# Patient Record
Sex: Female | Born: 1960 | Race: Black or African American | Hispanic: No | Marital: Single | Smoking: Never smoker
Health system: Southern US, Community
[De-identification: ages and names within clinical notes are randomized; demographics above are authoritative.]

## PROBLEM LIST (undated history)

## (undated) DIAGNOSIS — J45909 Unspecified asthma, uncomplicated: Secondary | ICD-10-CM

## (undated) DIAGNOSIS — M1990 Unspecified osteoarthritis, unspecified site: Secondary | ICD-10-CM

## (undated) DIAGNOSIS — E119 Type 2 diabetes mellitus without complications: Secondary | ICD-10-CM

---

## 2015-07-09 ENCOUNTER — Encounter (HOSPITAL_COMMUNITY): Payer: Self-pay | Admitting: Emergency Medicine

## 2015-07-09 ENCOUNTER — Emergency Department (HOSPITAL_COMMUNITY): Payer: Self-pay

## 2015-07-09 ENCOUNTER — Emergency Department (HOSPITAL_COMMUNITY)
Admission: EM | Admit: 2015-07-09 | Discharge: 2015-07-10 | Disposition: A | Discharge status: Home / Self Care | Payer: Self-pay | Attending: Emergency Medicine | Admitting: Emergency Medicine

## 2015-07-09 DIAGNOSIS — F29 Unspecified psychosis not due to a substance or known physiological condition: Secondary | ICD-10-CM | POA: Insufficient documentation

## 2015-07-09 DIAGNOSIS — J45901 Unspecified asthma with (acute) exacerbation: Secondary | ICD-10-CM | POA: Insufficient documentation

## 2015-07-09 DIAGNOSIS — F419 Anxiety disorder, unspecified: Secondary | ICD-10-CM | POA: Insufficient documentation

## 2015-07-09 DIAGNOSIS — E119 Type 2 diabetes mellitus without complications: Secondary | ICD-10-CM | POA: Insufficient documentation

## 2015-07-09 DIAGNOSIS — F39 Unspecified mood [affective] disorder: Secondary | ICD-10-CM | POA: Diagnosis present

## 2015-07-09 DIAGNOSIS — R0602 Shortness of breath: Secondary | ICD-10-CM

## 2015-07-09 HISTORY — DX: Unspecified asthma, uncomplicated: J45.909

## 2015-07-09 HISTORY — DX: Type 2 diabetes mellitus without complications: E11.9

## 2015-07-09 LAB — CBC WITH DIFFERENTIAL/PLATELET
BASOS ABS: 0.1 10*3/uL (ref 0.0–0.1)
Basophils Relative: 1 %
EOS ABS: 0.1 10*3/uL (ref 0.0–0.7)
Eosinophils Relative: 1 %
HCT: 37.6 % (ref 36.0–46.0)
HEMOGLOBIN: 12.1 g/dL (ref 12.0–15.0)
LYMPHS PCT: 21 %
Lymphs Abs: 1.1 10*3/uL (ref 0.7–4.0)
MCH: 27.3 pg (ref 26.0–34.0)
MCHC: 32.2 g/dL (ref 30.0–36.0)
MCV: 84.9 fL (ref 78.0–100.0)
Monocytes Absolute: 0.5 10*3/uL (ref 0.1–1.0)
Monocytes Relative: 9 %
NEUTROS ABS: 3.3 10*3/uL (ref 1.7–7.7)
NEUTROS PCT: 68 %
Platelets: 257 10*3/uL (ref 150–400)
RBC: 4.43 MIL/uL (ref 3.87–5.11)
RDW: 13.5 % (ref 11.5–15.5)
WBC: 5.1 10*3/uL (ref 4.0–10.5)

## 2015-07-09 LAB — RAPID URINE DRUG SCREEN, HOSP PERFORMED
Amphetamines: NOT DETECTED
BENZODIAZEPINES: NOT DETECTED
Barbiturates: NOT DETECTED
COCAINE: NOT DETECTED
OPIATES: NOT DETECTED
TETRAHYDROCANNABINOL: NOT DETECTED

## 2015-07-09 LAB — ETHANOL: Alcohol, Ethyl (B): <5 mg/dL (ref <5)

## 2015-07-09 LAB — BASIC METABOLIC PANEL
ANION GAP: 12 (ref 5–15)
BUN: 11 mg/dL (ref 6–20)
CHLORIDE: 103 mmol/L (ref 101–111)
CO2: 27 mmol/L (ref 22–32)
Calcium: 9.7 mg/dL (ref 8.9–10.3)
Creatinine, Ser: 0.86 mg/dL (ref 0.44–1.00)
GFR calc non Af Amer: >60 mL/min (ref >60)
GLUCOSE: 89 mg/dL (ref 65–99)
POTASSIUM: 4.7 mmol/L (ref 3.5–5.1)
Sodium: 142 mmol/L (ref 135–145)

## 2015-07-09 LAB — URINALYSIS, ROUTINE W REFLEX MICROSCOPIC
BILIRUBIN URINE: NEGATIVE
Glucose, UA: NEGATIVE mg/dL
Hgb urine dipstick: NEGATIVE
Ketones, ur: NEGATIVE mg/dL
NITRITE: NEGATIVE
Protein, ur: NEGATIVE mg/dL
SPECIFIC GRAVITY, URINE: 1.01 (ref 1.005–1.030)
pH: 8.5 — ABNORMAL HIGH (ref 5.0–8.0)

## 2015-07-09 LAB — SALICYLATE LEVEL

## 2015-07-09 LAB — URINE MICROSCOPIC-ADD ON

## 2015-07-09 LAB — ACETAMINOPHEN LEVEL

## 2015-07-09 LAB — CBG MONITORING, ED: Glucose-Capillary: 115 mg/dL — ABNORMAL HIGH (ref 65–99)

## 2015-07-09 MED ORDER — ACETAMINOPHEN 325 MG PO TABS
650.0000 mg | ORAL_TABLET | Freq: Four times a day (QID) | ORAL | Status: DC | PRN
  Administered 2015-07-09 – 2015-07-10 (×4): 650 mg via ORAL
  Filled 2015-07-09 (×4): qty 2

## 2015-07-09 MED ORDER — ACETAMINOPHEN 325 MG PO TABS: 650.0000 mg | ORAL_TABLET | Freq: Once | ORAL | Status: DC

## 2015-07-09 MED ORDER — ALBUTEROL SULFATE HFA 108 (90 BASE) MCG/ACT IN AERS
2.0000 | INHALATION_SPRAY | RESPIRATORY_TRACT | Status: DC | PRN
  Filled 2015-07-09: qty 6.7

## 2015-07-09 MED ORDER — DIAZEPAM 5 MG/ML IJ SOLN
5.0000 mg | Freq: Once | INTRAMUSCULAR | Status: AC
  Administered 2015-07-09: 5 mg via INTRAMUSCULAR
  Filled 2015-07-09: qty 2

## 2015-07-09 MED ORDER — GUAIFENESIN-DM 100-10 MG/5ML PO SYRP
5.0000 mL | ORAL_SOLUTION | ORAL | Status: DC | PRN
  Administered 2015-07-09 (×2): 5 mL via ORAL
  Filled 2015-07-09: qty 5
  Filled 2015-07-09 (×2): qty 10

## 2015-07-09 MED ORDER — BENZONATATE 100 MG PO CAPS
100.0000 mg | ORAL_CAPSULE | Freq: Once | ORAL | Status: AC
  Administered 2015-07-09: 200 mg via ORAL
  Filled 2015-07-09: qty 2

## 2015-07-09 MED ORDER — ALBUTEROL SULFATE HFA 108 (90 BASE) MCG/ACT IN AERS
2.0000 | INHALATION_SPRAY | RESPIRATORY_TRACT | Status: DC | PRN
  Administered 2015-07-09: 2 via RESPIRATORY_TRACT

## 2015-07-09 MED ORDER — FLUTICASONE PROPIONATE 50 MCG/ACT NA SUSP
2.0000 | Freq: Every day | NASAL | Status: DC
  Administered 2015-07-09: 2 via NASAL
  Filled 2015-07-09: qty 16

## 2015-07-09 MED ORDER — MENTHOL 3 MG MT LOZG
1.0000 | LOZENGE | OROMUCOSAL | Status: DC | PRN
  Administered 2015-07-09: 3 mg via ORAL
  Filled 2015-07-09: qty 9

## 2015-07-09 NOTE — ED Notes (Signed)
Patient transported to X-ray 

## 2015-07-09 NOTE — ED Notes (Addendum)
Pt is well spoken and stated she attended [employer] years ago and that is how she ended up back in  [place] from [place] . Pt stated she was living with her brother in [place] but felt that she was a burden so saved her food stamps and sold them to buy a bus ticket. Pt stated she wants to work and have a place to live. She is fearful that we are holding her here and she will incur huge bills she can not pay. Pt was reluctant to give Tom her brothers contact information for fear he would somehow use her social security number. At times pt does appear paranoid that we are writing bad things about her to make her have to stay here long .Pt c/o a cough.-Lung sounds are clear. She does state she has been on proair and advair but unsure of the dosages. Phoned EDP and pt was placed on cough syrup. Lung sounds clear with no rales/rhonchi.6pm Pt awakened to eat supper. She appears calm and stated she had a good nap. Pt did request more cough syrup but was told it was not time. Phoned EDP for tessalon pearls. 9:15pm will continue to monitor the pt closely. Pt c/o sore throat, nasal congestion but presently is afebrile. She stated earlier in the week she was vomitting. Pt was medicated with cough syrup and tessalon perals. Encouraging pt to drink po. Hot tea offered. 10:40pm _Pt was given 2 puffs of albuterol inhaler and a cough drop. She c/o nasal congestion and was given some flonase as well. Phoned charge to make her aware of the pts symptoms. (10:45pm)

## 2015-07-09 NOTE — ED Provider Notes (Signed)
CSN: [id number]     Arrival date & time 07/09/15  4098 History   First MD Initiated Contact with Patient 07/09/15 [phone]     Chief Complaint  Patient presents with  . Shortness of Breath    HPI   Ashley Hunt is an 55 y.o. female with history of asthma who presents [facility] of SOB.  She states her symptoms began one day ago while she was eating in the cafeteria of [facility]. States she drank coffee and suddenly felt like she was having an asthma attack and felt like she couldn't catch a good breath. States she feels like her throat is being tickled. States she has been on advair and albuterol rescue inhaler in the past but has not required either in over one year. States she recently moved to this area from [place]. She denies chest pain. Denies abdominal pain, n/v/d. During our conversation she seems anxious and has tangential thought and flight of ideas. She denies anxiety. She has had several instances of nonsensical words such as suddenly talking about "colored sequins" and then stating "i'm not lying with nobody." She takes breaths in the middle of her sentences but she has no retractions, actual increased WOB, or any wheezing on her exam. She is not tachypneic. She vehemently denies SI/HI and VH/AH.   Past Medical History  Diagnosis Date  . Asthma   . Diabetes mellitus without complication (HCC)    History reviewed. No pertinent past surgical history. Family History  Problem Relation Age of Onset  . Family history unknown: Yes   Social History  Substance Use Topics  . Smoking status: Never Smoker   . Smokeless tobacco: None  . Alcohol Use: No   OB History    Gravida Para Term Preterm AB TAB SAB Ectopic Multiple Living       Review of Systems  All other systems reviewed and are negative.     Allergies  Review of patient's allergies indicates no known allergies.  Home Medications   Prior to Admission  medications   Not on File   BP 119/74 mmHg  Pulse 93  Temp(Src) 98.5 F (36.9 C) (Oral)  Resp 20  SpO2 100% Physical Exam  Constitutional: She is oriented to person, place, and time.  HENT:  Right Ear: External ear normal.  Left Ear: External ear normal.  Nose: Nose normal.  Mouth/Throat: Oropharynx is clear and moist.  Eyes: Conjunctivae and EOM are normal. Pupils are equal, round, and reactive to light.  Neck: Normal range of motion. Neck supple.  Cardiovascular: Normal rate, regular rhythm, normal heart sounds and intact distal pulses.   Pulmonary/Chest: Effort normal and breath sounds normal. No respiratory distress. She has no wheezes. She has no rales. She exhibits no tenderness.  Abdominal: Soft. Bowel sounds are normal. She exhibits no distension. There is no tenderness.  Musculoskeletal: She exhibits no edema.  Neurological: She is alert and oriented to person, place, and time.  Skin: Skin is warm and dry.  Psychiatric: Her mood appears anxious. Her speech is rapid and/or pressured and tangential.  Nursing note and vitals reviewed.   ED Course  Procedures (including critical care time) Labs Review Labs Reviewed  ACETAMINOPHEN LEVEL - Abnormal; Notable for the following:    Acetaminophen (Tylenol), Serum <10 (*)    All other components within normal limits  URINALYSIS, ROUTINE W REFLEX MICROSCOPIC (NOT AT [facility]) -  Abnormal; Notable for the following:    APPearance CLOUDY (*)    pH 8.5 (*)    Leukocytes, UA MODERATE (*)    All other components within normal limits  URINE MICROSCOPIC-ADD ON - Abnormal; Notable for the following:    Squamous Epithelial / LPF 6-30 (*)    Bacteria, UA FEW (*)    All other components within normal limits  BASIC METABOLIC PANEL  ETHANOL  SALICYLATE LEVEL  CBC WITH DIFFERENTIAL/PLATELET  URINE RAPID DRUG SCREEN, HOSP PERFORMED    Imaging Review Dg Chest 2 View  07/09/2015  CLINICAL DATA:  Shortness breath, having flight of ideas.  EXAM: CHEST  2 VIEW COMPARISON:  None. FINDINGS: The heart size and mediastinal contours are within normal limits. Both lungs are clear. The visualized skeletal structures are unremarkable. IMPRESSION: No active cardiopulmonary disease. Electronically Signed   By: Elige Ko   On: 07/09/2015 10:00   I have personally reviewed and evaluated these images and lab results as part of my medical decision-making.   EKG Interpretation   Date/Time:  Wednesday July 09 2015 09:07:50 EST Ventricular Rate:  77 PR Interval:  111 QRS Duration: 120 QT Interval:  425 QTC Calculation: 481 R Axis:   77 Text Interpretation:  Sinus or ectopic atrial rhythm Borderline short PR  interval Nonspecific intraventricular conduction delay Artifact in lead(s)  I III aVR aVL aVF V1 V2 and baseline wander in lead(s) V3 Abnormal ECG No  previous tracing Confirmed by BEATON  MD, ROBERT ([id number]) on 07/09/2015  9:09:13 AM      MDM   Final diagnoses:  None    Pt is medically clear. I do not hear any adventitious lung sounds.  Her labs are unremarkable and CXR negative. TTS consulted and pt placed in psych hold given tangential thought and flight of ideas.     Carlene Coria, PA-C 07/09/15 1427  Nelva Nay, MD 07/10/15 [phone]

## 2015-07-09 NOTE — ED Notes (Addendum)
Pt arrived via EMS from [facility] with report of pt c/o asthama, abd pain and anxiety. Pt stating that her breathing is restricting in her neck. Pt having flight of ideas but denies SI/HI and audible/visual hallucinations. Pt moved here from [place] x1 week ago.

## 2015-07-09 NOTE — ED Notes (Signed)
Bed: ZO10 Expected date:  Expected time:  Means of arrival:  Comments: RM 11

## 2015-07-09 NOTE — ED Notes (Signed)
PSych in room

## 2015-07-09 NOTE — ED Notes (Signed)
Pt given sandwich, graham cracker and OJ.

## 2015-07-09 NOTE — ED Notes (Signed)
Bed: ZO10 Expected date:  Expected time:  Means of arrival:  Comments: EMS- 54yo F, behavior issues
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
## 2015-07-09 NOTE — ED Notes (Signed)
Lunch tray placed in patient room

## 2015-07-09 NOTE — BH Assessment (Addendum)
Tele Assessment Note   Ashley Hunt is an 55 y.o. female that presents this date from [facility] that was transported by EMS from [facility]. Patient is very disorganized upon presentation and was difficult to redirect. Patient was very confused at times, and would string words together (word salad) so information could not be obtained. Patient was not time, place oriented and said the year was "every year God said it would be." Patient did state that she came here on Saturday 07/05/15 from [place]  by bus to see and reside with a boyfriend that she has not had contact with in years. When patient realized boyfriend no longer resided in [place] she went to [employer] and after staff interviewed her had her transported to [facility] by EMS. Patient was a poor historian and was unable to answer the questions on the assessment, this writer gathered collateral from the intake nurse who stated patient had been talking to herself and was easily agitated when spoken to. Patient could not provide any mental health history and would move out of view of the monitor refusing at times, to stand in front of the camera. This Clinical research associate was unable to complete an assessment and contacted Ashley Hunt to staff case with her who stated she would also evaluate patient while in the SAPPU. After speaking to patient Ashley Hunt agreed patient was very disorganized and more information was needed before a disposition could be rendered. Information in reference to patient's brother was obtained from patient although this writer could not reach the brother at the number provided [phone]. Patient will be held in SAPPU overnight and revaluated in the a.m. after further information is obtained or patient becomes stable.          Diagnosis: Psychosis  Past Medical History:  Past Medical History  Diagnosis Date  . Asthma   . Diabetes mellitus without complication (HCC)     History reviewed. No pertinent past surgical  history.  Family History:  Family History  Problem Relation Age of Onset  . Family history unknown: Yes    Social History:  reports that she has never smoked. She does not have any smokeless tobacco history on file. She reports that she does not drink alcohol or use illicit drugs.  Additional Social History:  Alcohol / Drug Use Pain Medications: See MAR Prescriptions: See MAR Over the Counter: See MAR History of alcohol / drug use?: No history of alcohol / drug abuse (Denies although waiting on UDS, status in question) Longest period of sobriety (when/how long): Denies  CIWA: CIWA-Ar BP: 119/74 mmHg Pulse Rate: 93 COWS:    PATIENT STRENGTHS: (choose at least two) Average or above average intelligence Supportive family/friends  Allergies: No Known Allergies  Home Medications:  (Not in a hospital admission)  OB/GYN Status:  Patient's last menstrual period was 05/25/2015 (approximate).  General Assessment Data [facility] of Assessment: [facility] TTS Assessment: In system Is this a Tele or Face-to-Face Assessment?: Tele Assessment Is this an Initial Assessment or a Re-assessment for this encounter?: Initial Assessment Marital status: Single Maiden name: na Is patient pregnant?: Unknown Pregnancy Status: Unknown Living Arrangements: Other (Comment) (Homeless) Can pt return to current living arrangement?: Yes (Currently being assisted by [employer]) Admission Status: Other (Comment) Is patient capable of signing voluntary admission?: No (Status pending waiting UDS results  ) Referral Source: Self/Family/Friend Insurance type: none  Medical Screening Exam [facility] Walk-in ONLY) Medical Exam completed: Yes  Crisis Care Plan Living Arrangements: Other (Comment) (Homeless) Legal  Guardian: Other: (Unknown) Name of Psychiatrist: none Name of Therapist: none  Education Status Is patient currently in school?: No Current Grade: na Highest grade of school patient has completed:  68 Name of school: na Contact person: na  Risk to self with the past 6 months Suicidal Ideation: No Has patient been a risk to self within the past 6 months prior to admission? : Other (comment) (Unknown) Suicidal Intent: No Has patient had any suicidal intent within the past 6 months prior to admission? : Other (comment) Is patient at risk for suicide?: No Suicidal Plan?: No Has patient had any suicidal plan within the past 6 months prior to admission? : No Access to Means: No What has been your use of drugs/alcohol within the last 12 months?: Denies Previous Attempts/Gestures: No How many times?: 0 Other Self Harm Risks: None Triggers for Past Attempts: Unknown Intentional Self Injurious Behavior: None Family Suicide History: Unknown Recent stressful life event(s): Other (Comment) (relocated to [facility]) Persecutory voices/beliefs?: No Depression: Yes Depression Symptoms: Despondent Substance abuse history and/or treatment for substance abuse?: No Suicide prevention information given to non-admitted patients: Not applicable  Risk to Others within the past 6 months Homicidal Ideation: No Does patient have any lifetime risk of violence toward others beyond the six months prior to admission? : Unknown Thoughts of Harm to Others: No Current Homicidal Intent: No Current Homicidal Plan: No Access to Homicidal Means: No Identified Victim: na History of harm to others?: No Assessment of Violence: None Noted Violent Behavior Description: na Does patient have access to weapons?: No Criminal Charges Pending?: No Does patient have a court date: No Is patient on probation?: Unknown  Psychosis Hallucinations: None noted Delusions: None noted  Mental Status Report Appearance/Hygiene: In scrubs Eye Contact: Poor Motor Activity: Agitation Speech: Aggressive Level of Consciousness: Alert Mood: Anxious Affect: Anxious Anxiety Level: Minimal Thought Processes:  Tangential Judgement: Impaired Orientation: Not oriented Obsessive Compulsive Thoughts/Behaviors: None  Cognitive Functioning Concentration: Poor Memory: Recent Intact, Remote Intact IQ: Average Level of Function: Unknown Insight: Poor Impulse Control: Unable to Assess Appetite: Fair Weight Loss: 0 Weight Gain: 0 Sleep: Decreased Total Hours of Sleep: 4 Vegetative Symptoms: None  ADLScreening [facility] Assessment Services) Patient's cognitive ability adequate to safely complete daily activities?: No Patient able to express need for assistance with ADLs?: Yes Independently performs ADLs?: Yes (appropriate for developmental age)  Prior Inpatient Therapy Prior Inpatient Therapy: No Prior Therapy Dates: Unknown Prior Therapy Facilty/Provider(s): Unknown Reason for Treatment: Unknown  Prior Outpatient Therapy Prior Outpatient Therapy: No Prior Therapy Dates: Unknown Prior Therapy Facilty/Provider(s): Unknown Reason for Treatment: Unknown Does patient have an ACCT team?: No Does patient have Intensive In-House Services?  : No Does patient have [facility] services? : No Does patient have P4CC services?: No  ADL Screening (condition at time of admission) Patient's cognitive ability adequate to safely complete daily activities?: No Is the patient deaf or have difficulty hearing?: No Does the patient have difficulty seeing, even when wearing glasses/contacts?: No Does the patient have difficulty concentrating, remembering, or making decisions?: Yes Patient able to express need for assistance with ADLs?: Yes Does the patient have difficulty dressing or bathing?: No Independently performs ADLs?: Yes (appropriate for developmental age) Does the patient have difficulty walking or climbing stairs?: No Weakness of Legs: None Weakness of Arms/Hands: None  Home Assistive Devices/Equipment Home Assistive Devices/Equipment: None  Therapy Consults (therapy consults require a physician  order) PT Evaluation Needed: No OT Evalulation Needed: No SLP Evaluation Needed: No Abuse/Neglect  Assessment (Assessment to be complete while patient is alone) Physical Abuse: Denies Verbal Abuse: Denies Sexual Abuse: Denies Exploitation of patient/patient's resources: Denies Self-Neglect: Denies Values / Beliefs Cultural Requests During Hospitalization: None Spiritual Requests During Hospitalization: None Consults Spiritual Care Consult Needed: No Social Work Consult Needed: No Merchant navy officer (For Healthcare) Does patient have an advance directive?: No Would patient like information on creating an advanced directive?: No - patient declined information    Additional Information 1:1 In Past 12 Months?: No CIRT Risk: No Elopement Risk: No Does patient have medical clearance?: No     Disposition: Patient will be held in SAPPU overnight and revaluated in the a.m. after further information is obtained or patient becomes stable.           Disposition Initial Assessment Completed for this Encounter: Yes Disposition of Patient: Other dispositions ( consult) Other disposition(s): Other (Comment) (Pending consult)  Alfredia Ferguson 07/09/2015 11:22 AM

## 2015-07-09 NOTE — ED Notes (Signed)
Awake. Verbally responsive. Resp even and unlabored. ABC's intact. Pt continues to report that she needs to wear oxygen d/t "restriction of my breathing" Pt sats at 100% RA. Pt informed that she does not need oxygen at this time. NAD noted.

## 2015-07-10 DIAGNOSIS — F39 Unspecified mood [affective] disorder: Secondary | ICD-10-CM

## 2015-07-10 DIAGNOSIS — F419 Anxiety disorder, unspecified: Secondary | ICD-10-CM

## 2015-07-10 NOTE — BH Assessment (Signed)
[facility] Assessment Progress Note  Per Thedore Mins, MD, this pt does not require psychiatric hospitalization at this time.  She also does not require any outpatient behavioral health services.  She is to be discharged from [facility] with information for supportive services for the homeless.  Discharge instructions include information for [facility], [employer] and the [employer].  Pt's nurse, Dawnaly, has been notified.  Doylene Canning, MA Triage Specialist [phone]

## 2015-07-10 NOTE — ED Notes (Signed)
Patient denies SI, HI and AVH and states that there nothing wrong with her. Plan of care discussed. Patient given a meal. Encouragement and support provided and safety maintain. Q 15 min safety checks in place.

## 2015-07-10 NOTE — Discharge Instructions (Addendum)
For your shelter needs, contact one of the following shelters:       [facility] (operated by [facility])      [address] [place], [place] [address]      [phone]       [facility]      [address]      [place], [place] [address]      [phone]  For a wide range of supportive services for the homeless, contact the [facility]:       [facility]      [address] [place], [place] [address]      [phone]

## 2015-07-10 NOTE — ED Notes (Signed)
Bed: WBH39 Expected date:  Expected time:  Means of arrival:  Comments: Hold for 33 

## 2015-07-10 NOTE — Progress Notes (Signed)
D/c instructions entered in EPIC  Please use the resources provided to you in emergency room by case manager to assist with doctor for follow up These [place] uninsured resources provide possible primary care providers, resources for discounted medications, housing, dental resources, affordable care act information, plus other resources for [employer]  [employer]) Go to [address], [place], [place] [address] [phone] by 2 pm Monday-Friday & sign in at the front desk. Come if you need showers, laundry, computer access, job counseling, resume help, referrals for food/clothing, Mental Health assist, housing

## 2015-07-10 NOTE — ED Notes (Signed)
Patient discharged to home.  She was given a bus pass and all belongings were returned.  She denies thoughts of harm to self or others.  She denies auditory or visual hallucinations.  She left the unit ambulatory.

## 2015-07-10 NOTE — Consult Note (Signed)
[facility] Psychiatry Consult   Reason for Consult:  Flu symptoms, Homelessness,  Referring Physician: EDP Patient Identification: Ashley Hunt MRN:  [id number] Principal Diagnosis: Unspecified mood (affective) disorder ([place]) Diagnosis:   Patient Active Problem List   Diagnosis Date Noted  . Unspecified mood (affective) disorder ([place]) [F39] 07/10/2015    Priority: High    Total Time spent with patient: 45 minutes  Subjective:   Ashley Hunt is a 55 y.o. female patient admitted with  Flu symptoms, Homelessness, .  HPI:  AA female, 55 years old was evaluated today for unspecified mood disorder.  Patient is alert and oriented x 3.  She stated that she is surprised she was sent to see Psychiatry.  Patient reports that she came in to the ER for throat discomfort and was sent to be seen by Psych.  She denies previous Psychiatric hx and have never taken any Psychotropic medications.  Patient however admitted that she moved down from [facility] after she felt she could not stay with her brother and sister in law.  Patient states she came to [facility] [place] to see and ex-boyfriend but found out that he moved away from this area.  Patient has been staying at [facility] and was brought to the ER by EMS for throat discomfort.  Patient denies SI/HI/AVH.  She is looking for job and plans to find a place to stay here in [place].   Attempt to contact her brother in [place] failed as the phone messages left were not returned.  She is Psychiatrically cleared and has been given information to the [facility])  Patient is discharged based on information received from [place].  Patient is given Tylenol for her fever and is ready to go home.   Patient is discharged.  Past Psychiatric History:  Denies  Risk to Self: Suicidal Ideation: No Suicidal Intent: No Is patient at risk for suicide?: No Suicidal Plan?: No Access to Means: No What has been your use of drugs/alcohol within the  last 12 months?: Denies How many times?: 0 Other Self Harm Risks: None Triggers for Past Attempts: Unknown Intentional Self Injurious Behavior: None Risk to Others: Homicidal Ideation: No Thoughts of Harm to Others: No Current Homicidal Intent: No Current Homicidal Plan: No Access to Homicidal Means: No Identified Victim: na History of harm to others?: No Assessment of Violence: None Noted Violent Behavior Description: na Does patient have access to weapons?: No Criminal Charges Pending?: No Does patient have a court date: No Prior Inpatient Therapy: Prior Inpatient Therapy: No Prior Therapy Dates: Unknown Prior Therapy Facilty/Provider(s): Unknown Reason for Treatment: Unknown Prior Outpatient Therapy: Prior Outpatient Therapy: No Prior Therapy Dates: Unknown Prior Therapy Facilty/Provider(s): Unknown Reason for Treatment: Unknown Does patient have an ACCT team?: No Does patient have Intensive In-House Services?  : No Does patient have [facility] services? : No Does patient have P4CC services?: No  Past Medical History:  Past Medical History  Diagnosis Date  . Asthma   . Diabetes mellitus without complication ([place])    History reviewed. No pertinent past surgical history. Family History:  Family History  Problem Relation Age of Onset  . Family history unknown: Yes   Family Psychiatric  History:  Denies Social History:  History  Alcohol Use No     History  Drug Use No    Social History   Social History  . Marital Status: Single    Spouse Name: N/A  . Number of Children: N/A  . Years  of Education: N/A   Social History Main Topics  . Smoking status: Never Smoker   . Smokeless tobacco: None  . Alcohol Use: No  . Drug Use: No  . Sexual Activity: No   Other Topics Concern  . None   Social History Narrative  . None   Additional Social History:    Pain Medications: See MAR Prescriptions: See MAR Over the Counter: See MAR History of alcohol / drug use?:  No history of alcohol / drug abuse (Denies although waiting on UDS, status in question) Longest period of sobriety (when/how long): Denies    Allergies:  No Known Allergies  Labs:  Results for orders placed or performed during the hospital encounter of 07/09/15 (from the past 48 hour(s))  Urinalysis, Routine w reflex microscopic     Status: Abnormal   Collection Time: 07/09/15  8:24 AM  Result Value Ref Range   Color, Urine YELLOW YELLOW   APPearance CLOUDY (A) CLEAR   Specific Gravity, Urine 1.010 1.005 - 1.030   pH 8.5 (H) 5.0 - 8.0   Glucose, UA NEGATIVE NEGATIVE mg/dL   Hgb urine dipstick NEGATIVE NEGATIVE   Bilirubin Urine NEGATIVE NEGATIVE   Ketones, ur NEGATIVE NEGATIVE mg/dL   Protein, ur NEGATIVE NEGATIVE mg/dL   Nitrite NEGATIVE NEGATIVE   Leukocytes, UA MODERATE (A) NEGATIVE  Urine rapid drug screen (hosp performed)     Status: None   Collection Time: 07/09/15  8:24 AM  Result Value Ref Range   Opiates NONE DETECTED NONE DETECTED   Cocaine NONE DETECTED NONE DETECTED   Benzodiazepines NONE DETECTED NONE DETECTED   Amphetamines NONE DETECTED NONE DETECTED   Tetrahydrocannabinol NONE DETECTED NONE DETECTED   Barbiturates NONE DETECTED NONE DETECTED    Comment:        DRUG SCREEN FOR MEDICAL PURPOSES ONLY.  IF CONFIRMATION IS NEEDED FOR ANY PURPOSE, NOTIFY LAB WITHIN 5 DAYS.        LOWEST DETECTABLE LIMITS FOR URINE DRUG SCREEN Drug Class       Cutoff (ng/mL) Amphetamine      1000 Barbiturate      200 Benzodiazepine   734 Tricyclics       193 Opiates          300 Cocaine          300 THC              50   Urine microscopic-add on     Status: Abnormal   Collection Time: 07/09/15  8:24 AM  Result Value Ref Range   Squamous Epithelial / LPF 6-30 (A) NONE SEEN   WBC, UA 6-30 0 - 5 WBC/hpf   RBC / HPF 0-5 0 - 5 RBC/hpf   Bacteria, UA FEW (A) NONE SEEN  Basic metabolic panel     Status: None   Collection Time: 07/09/15  9:14 AM  Result Value Ref Range    Sodium 142 135 - 145 mmol/L   Potassium 4.7 3.5 - 5.1 mmol/L   Chloride 103 101 - 111 mmol/L   CO2 27 22 - 32 mmol/L   Glucose, Bld 89 65 - 99 mg/dL   BUN 11 6 - 20 mg/dL   Creatinine, Ser 0.86 0.44 - 1.00 mg/dL   Calcium 9.7 8.9 - 10.3 mg/dL   GFR calc non Af Amer >60 >60 mL/min   GFR calc Af Amer >60 >60 mL/min    Comment: (NOTE) The eGFR has been calculated using the CKD EPI equation. This  calculation has not been validated in all clinical situations. eGFR's persistently <60 mL/min signify possible Chronic Kidney Disease.    Anion gap 12 5 - 15  Ethanol     Status: None   Collection Time: 07/09/15  9:14 AM  Result Value Ref Range   Alcohol, Ethyl (B) <5 <5 mg/dL    Comment:        LOWEST DETECTABLE LIMIT FOR SERUM ALCOHOL IS 5 mg/dL FOR MEDICAL PURPOSES ONLY   Salicylate level     Status: None   Collection Time: 07/09/15  9:14 AM  Result Value Ref Range   Salicylate Lvl <8.1 2.8 - 30.0 mg/dL  Acetaminophen level     Status: Abnormal   Collection Time: 07/09/15  9:14 AM  Result Value Ref Range   Acetaminophen (Tylenol), Serum <10 (L) 10 - 30 ug/mL    Comment:        THERAPEUTIC CONCENTRATIONS VARY SIGNIFICANTLY. A RANGE OF 10-30 ug/mL MAY BE AN EFFECTIVE CONCENTRATION FOR MANY PATIENTS. HOWEVER, SOME ARE BEST TREATED AT CONCENTRATIONS OUTSIDE THIS RANGE. ACETAMINOPHEN CONCENTRATIONS >150 ug/mL AT 4 HOURS AFTER INGESTION AND >50 ug/mL AT 12 HOURS AFTER INGESTION ARE OFTEN ASSOCIATED WITH TOXIC REACTIONS.   CBC with Differential     Status: None   Collection Time: 07/09/15  9:14 AM  Result Value Ref Range   WBC 5.1 4.0 - 10.5 K/uL   RBC 4.43 3.87 - 5.11 MIL/uL   Hemoglobin 12.1 12.0 - 15.0 g/dL   HCT 37.6 36.0 - 46.0 %   MCV 84.9 78.0 - 100.0 fL   MCH 27.3 26.0 - 34.0 pg   MCHC 32.2 30.0 - 36.0 g/dL   RDW 13.5 11.5 - 15.5 %   Platelets 257 150 - 400 K/uL   Neutrophils Relative % 68 %   Lymphocytes Relative 21 %   Monocytes Relative 9 %   Eosinophils  Relative 1 %   Basophils Relative 1 %   Neutro Abs 3.3 1.7 - 7.7 K/uL   Lymphs Abs 1.1 0.7 - 4.0 K/uL   Monocytes Absolute 0.5 0.1 - 1.0 K/uL   Eosinophils Absolute 0.1 0.0 - 0.7 K/uL   Basophils Absolute 0.1 0.0 - 0.1 K/uL   Smear Review MORPHOLOGY UNREMARKABLE   CBG monitoring, ED     Status: Abnormal   Collection Time: 07/09/15  1:35 PM  Result Value Ref Range   Glucose-Capillary 115 (H) 65 - 99 mg/dL    Current Facility-Administered Medications  Medication Dose Route Frequency Provider Last Rate Last Dose  . acetaminophen (TYLENOL) tablet 650 mg  650 mg Oral Q6H PRN Ivin Booty, MD   650 mg at 07/10/15 1136  . albuterol (PROVENTIL HFA;VENTOLIN HFA) 108 (90 Base) MCG/ACT inhaler 2 puff  2 puff Inhalation Q4H PRN Waynetta Pean, PA-C   2 puff at 07/09/15 2229  . fluticasone (FLONASE) 50 MCG/ACT nasal spray 2 spray  2 spray Each Nare Daily Waynetta Pean, PA-C   2 spray at 07/09/15 2229  . guaiFENesin-dextromethorphan (ROBITUSSIN DM) 100-10 MG/5ML syrup 5 mL  5 mL Oral Q4H PRN Leonard Schwartz, MD   5 mL at 07/09/15 2107  . menthol-cetylpyridinium (CEPACOL) lozenge 3 mg  1 lozenge Oral PRN Ivin Booty, MD   3 mg at 07/09/15 2124   No current outpatient prescriptions on file.    Musculoskeletal: Strength & Muscle Tone: within normal limits Gait & Station: normal Patient leans: N/A  Psychiatric Specialty Exam: ROS  Blood pressure 106/65, pulse 88, temperature 102.2 F (  39 C), temperature source Oral, resp. rate 20, last menstrual period 05/25/2015, SpO2 100 %.There is no height or weight on file to calculate BMI.  General Appearance: Casual and Fairly Groomed  Engineer, water::  Good  Speech:  Clear and Coherent and Normal Rate  Volume:  Normal  Mood:  Anxious and Euthymic  Affect:  Congruent  Thought Process:  Coherent, Goal Directed and Intact  Orientation:  Full (Time, Place, and Person)  Thought Content:  WDL  Suicidal Thoughts:  No  Homicidal Thoughts:  No  Memory:   Immediate;   Good Recent;   Good Remote;   Good  Judgement:  Fair  Insight:  Good  Psychomotor Activity:  Normal  Concentration:  Good  Recall:  NA  Fund of Knowledge:Good  Language: Good  Akathisia:  NA  Handed:  Right  AIMS (if indicated):     Assets:  Desire for Improvement  ADL's:  Intact  Cognition: WNL  Sleep:       Disposition:  Psychiatrically cleared.  May seek housing at the [facility].  Information provided for [facility] resources. Discharge home.  Ashley Hunt    PMHNP-BC 07/10/2015 12:27 PM Patient seen face-to-face for psychiatric evaluation, chart reviewed and case discussed with the physician extender and developed treatment plan. Reviewed the information documented and agree with the treatment plan. Corena Pilgrim, MD

## 2015-07-10 NOTE — ED Notes (Addendum)
MHT reported temp at 103.  EDP was notified (Dr. Effie Shy).  Patient will be given Tylenol and will be rechecked.  Per Dr. Effie Shy patient can be discharged home.

## 2015-07-10 NOTE — [facility] Suicide Risk Assessment (Cosign Needed)
Suicide Risk Assessment  Discharge Assessment   [facility] Discharge Suicide Risk Assessment   Principal Problem: Unspecified mood (affective) disorder [facility]) Discharge Diagnoses:  Patient Active Problem List   Diagnosis Date Noted  . Unspecified mood (affective) disorder (HCC) [F39] 07/10/2015    Priority: High  . Anxiety [F41.9]     Total Time spent with patient: 20 minutes  Musculoskeletal: Strength & Muscle Tone: within normal limits Gait & Station: normal Patient leans: N/A  Psychiatric Specialty Exam:   Blood pressure 106/65, pulse 88, temperature 99.9 F (37.7 C), temperature source Oral, resp. rate 20, last menstrual period 05/25/2015, SpO2 100 %.There is no height or weight on file to calculate BMI.  General Appearance: Casual and Fairly Groomed  Patent attorney:: Good  Speech: Clear and Coherent and Normal Rate  Volume: Normal  Mood: Anxious and Euthymic  Affect: Congruent  Thought Process: Coherent, Goal Directed and Intact  Orientation: Full (Time, Place, and Person)  Thought Content: WDL  Suicidal Thoughts: No  Homicidal Thoughts: No  Memory: Immediate; Good Recent; Good Remote; Good  Judgement: Fair  Insight: Good  Psychomotor Activity: Normal  Concentration: Good  Recall: NA  Fund of Knowledge:Good  Language: Good  Akathisia: NA  Handed: Right  AIMS (if indicated):    Assets: Desire for Improvement  ADL's: Intact  Cognition: WNL         Mental Status Per Nursing Assessment::   On Admission:     Demographic Factors:  Low socioeconomic status, Living alone and Unemployed  Loss Factors: NA  Historical Factors: NA  Risk Reduction Factors:   Religious beliefs about death  Continued Clinical Symptoms:  Severe Anxiety and/or Agitation  Cognitive Features That Contribute To Risk:  Polarized thinking    Suicide Risk:  Minimal: No identifiable suicidal ideation.  Patients presenting with no risk  factors but with morbid ruminations; may be classified as minimal risk based on the severity of the depressive symptoms  Follow-up Information    Follow up with Please use the resources provided to you in emergency room by case manager to assist with doctor for follow up .   Contact information:   These [place] uninsured resources provide possible primary care providers, resources for discounted medications, housing, dental resources, affordable care act information, plus other resources for [employer]        Go to [employer]).   Contact information:   [address] [place], [place] [address] [phone] by 2 pm Monday-Friday & sign in at the front desk.  Come if you need showers, laundry, computer access, job counseling, resume help, referrals for food/clothing, Mental Health assist, housing      Plan Of Care/Follow-up recommendations:  Activity:  as tolerated Diet:  regular  Earney Navy, NP    PMHNP-BC 07/10/2015, 2:17 PM

## 2015-07-10 NOTE — Progress Notes (Signed)
CM spoke with pt who confirms uninsured Ashley Hunt resident with no pcp.  CM discussed and provided written information for uninsured accepting pcps, discussed the importance of pcp vs EDP services for f/u care, www.needymeds.org, www.goodrx.com, discounted pharmacies and other Ashley Hunt such as [employer] [facility] , [employer], affordable care act, financial assistance, uninsured dental services, [place] med assist, DSS and  health department  Reviewed resources for Ashley Hunt uninsured accepting pcps like Ashley Hunt, family medicine at [employer], [facility], [facility], local urgent care centers, [facility], [facility] family practice, general medical clinics, family services of the [place], [facility] urgent care plus others, medication resources, [facility] out patient pharmacies and housing Pt voiced understanding and appreciation of resources provided   Provided [facility] contact information Pt seen by Kennyth Arnold of [facility] today to provide her with resources for [facility] and [facility]

## 2015-08-06 ENCOUNTER — Encounter (HOSPITAL_COMMUNITY): Payer: Self-pay | Admitting: Emergency Medicine

## 2015-08-06 ENCOUNTER — Emergency Department (HOSPITAL_COMMUNITY)
Admission: EM | Admit: 2015-08-06 | Discharge: 2015-08-06 | Disposition: A | Discharge status: Home / Self Care | Payer: Self-pay | Attending: Emergency Medicine | Admitting: Emergency Medicine

## 2015-08-06 DIAGNOSIS — R197 Diarrhea, unspecified: Secondary | ICD-10-CM | POA: Insufficient documentation

## 2015-08-06 DIAGNOSIS — F419 Anxiety disorder, unspecified: Secondary | ICD-10-CM | POA: Insufficient documentation

## 2015-08-06 DIAGNOSIS — J45909 Unspecified asthma, uncomplicated: Secondary | ICD-10-CM | POA: Insufficient documentation

## 2015-08-06 DIAGNOSIS — F39 Unspecified mood [affective] disorder: Secondary | ICD-10-CM | POA: Insufficient documentation

## 2015-08-06 DIAGNOSIS — Z3202 Encounter for pregnancy test, result negative: Secondary | ICD-10-CM | POA: Insufficient documentation

## 2015-08-06 DIAGNOSIS — E119 Type 2 diabetes mellitus without complications: Secondary | ICD-10-CM | POA: Insufficient documentation

## 2015-08-06 LAB — COMPREHENSIVE METABOLIC PANEL WITH GFR
ALT: 12 U/L — ABNORMAL LOW (ref 14–54)
AST: 19 U/L (ref 15–41)
Albumin: 3.7 g/dL (ref 3.5–5.0)
Alkaline Phosphatase: 45 U/L (ref 38–126)
Anion gap: 9 (ref 5–15)
BUN: 14 mg/dL (ref 6–20)
CO2: 27 mmol/L (ref 22–32)
Calcium: 8.8 mg/dL — ABNORMAL LOW (ref 8.9–10.3)
Chloride: 107 mmol/L (ref 101–111)
Creatinine, Ser: 0.64 mg/dL (ref 0.44–1.00)
GFR calc Af Amer: >60 mL/min
GFR calc non Af Amer: >60 mL/min
Glucose, Bld: 116 mg/dL — ABNORMAL HIGH (ref 65–99)
Potassium: 3.8 mmol/L (ref 3.5–5.1)
Sodium: 143 mmol/L (ref 135–145)
Total Bilirubin: 0.5 mg/dL (ref 0.3–1.2)
Total Protein: 7.6 g/dL (ref 6.5–8.1)

## 2015-08-06 LAB — CBC
HCT: 35.8 % — ABNORMAL LOW (ref 36.0–46.0)
Hemoglobin: 11.5 g/dL — ABNORMAL LOW (ref 12.0–15.0)
MCH: 27.6 pg (ref 26.0–34.0)
MCHC: 32.1 g/dL (ref 30.0–36.0)
MCV: 85.9 fL (ref 78.0–100.0)
Platelets: 297 K/uL (ref 150–400)
RBC: 4.17 MIL/uL (ref 3.87–5.11)
RDW: 14.3 % (ref 11.5–15.5)
WBC: 9.7 K/uL (ref 4.0–10.5)

## 2015-08-06 LAB — I-STAT BETA HCG BLOOD, ED (MC, WL, AP ONLY): I-stat hCG, quantitative: <5 m[IU]/mL

## 2015-08-06 LAB — LIPASE, BLOOD: Lipase: 39 U/L (ref 11–51)

## 2015-08-06 MED ORDER — HALOPERIDOL LACTATE 5 MG/ML IJ SOLN
2.5000 mg | Freq: Once | INTRAMUSCULAR | Status: AC
  Administered 2015-08-06: 2.5 mg via INTRAMUSCULAR
  Filled 2015-08-06: qty 1

## 2015-08-06 MED ORDER — ONDANSETRON 8 MG PO TBDP
8.0000 mg | ORAL_TABLET | Freq: Once | ORAL | Status: AC
  Administered 2015-08-06: 8 mg via ORAL
  Filled 2015-08-06: qty 1

## 2015-08-06 MED ORDER — PROMETHAZINE HCL 25 MG PO TABS: 25.0000 mg | ORAL_TABLET | Freq: Four times a day (QID) | ORAL | Status: DC | PRN

## 2015-08-06 NOTE — ED Notes (Addendum)
Pt states that she has had N/V/D and sinus congestion x 3 weeks. CBG 95. Alert and oriented.

## 2015-08-06 NOTE — ED Notes (Signed)
Bed: WTR7 Expected date:  Expected time:  Means of arrival:  Comments: Labs 

## 2015-08-06 NOTE — Discharge Instructions (Signed)

## 2015-08-06 NOTE — ED Notes (Signed)
Per MD, the plan is to feed and observe the Pt for n/v/d.

## 2015-08-06 NOTE — ED Notes (Signed)
Pt. Is unable to urinate at this time.  

## 2015-08-06 NOTE — ED Provider Notes (Signed)
CSN: [id number]     Arrival date & time 08/06/15  0142 History   First MD Initiated Contact with Patient 08/06/15 [phone]     Chief Complaint  Patient presents with  . Emesis  . Diarrhea      HPI Patient presents with chief complaint of diffuse nausea vomiting diarrhea for 3 weeks.  She has a diarrhea and vomiting and persistent.  She is history of psychiatric disorder and was seen last month at [facility].  She says she's had sinus congestion bloody nose.  [facility]. Past Medical History  Diagnosis Date  . Asthma   . Diabetes mellitus without complication (HCC)    History reviewed. No pertinent past surgical history. Family History  Problem Relation Age of Onset  . Family history unknown: Yes   Social History  Substance Use Topics  . Smoking status: Never Smoker   . Smokeless tobacco: None  . Alcohol Use: No   OB History    Gravida Para Term Preterm AB TAB SAB Ectopic Multiple Living       Review of Systems  All other systems reviewed and are negative  Allergies  Review of patient's allergies indicates no known allergies.  Home Medications   Prior to Admission medications   Medication Sig Start Date End Date Taking? Authorizing Provider  promethazine (PHENERGAN) 25 MG tablet Take 1 tablet (25 mg total) by mouth every 6 (six) hours as needed for nausea or vomiting. 08/06/15   Nelva Nay, MD   BP 104/72 mmHg  Pulse 56  Temp(Src) 98.5 F (36.9 C) (Oral)  Resp 18  Ht  (1.676 m)  Wt 151 lb (68.493 kg)  BMI 24.38 kg/m2  SpO2 100%  LMP 08/06/2015 (Approximate) Physical Exam  Constitutional: She is oriented to person, place, and time. She appears well-developed and well-nourished. No distress.  HENT:  Head: Normocephalic and atraumatic.  Eyes: Pupils are equal, round, and reactive to light.  Neck: Normal range of motion.  Cardiovascular: Normal rate and intact distal pulses.   Pulmonary/Chest:  No respiratory distress.  Abdominal: Normal appearance. She exhibits no distension.  Musculoskeletal: Normal range of motion.  Neurological: She is alert and oriented to person, place, and time. No cranial nerve deficit.  Skin: Skin is warm and dry. No rash noted.  Psychiatric: Her behavior is normal. Her mood appears anxious. Her speech is rapid and/or pressured. She does not express impulsivity. She expresses no homicidal and no suicidal ideation.  Nursing note and vitals reviewed.   ED Course  Procedures (including critical care time) Medications  haloperidol lactate (HALDOL) injection 2.5 mg (not administered)  ondansetron (ZOFRAN-ODT) disintegrating tablet 8 mg (8 mg Oral Given 08/06/15 0229)    Labs Review Labs Reviewed  COMPREHENSIVE METABOLIC PANEL - Abnormal; Notable for the following:    Glucose, Bld 116 (*)    Calcium 8.8 (*)    ALT 12 (*)    All other components within normal limits  CBC - Abnormal; Notable for the following:    Hemoglobin 11.5 (*)    HCT 35.8 (*)    All other components within normal limits  LIPASE, BLOOD  URINALYSIS, ROUTINE W REFLEX MICROSCOPIC (NOT AT [facility])  I-STAT BETA HCG BLOOD, ED ([facility], [facility], [facility] ONLY)         MDM   Final diagnoses:  Unspecified mood (affective) disorder (HCC)  Nelva Nay, MD 08/06/15 1013

## 2015-08-06 NOTE — ED Notes (Signed)
Pt c/o "constant" n/v/d x "3-4 weeks."  Pt reports that she was previously seen at [facility] for same and they gave her "a shot which was either an antibiotic or the flu shot" and "a pill that made my throat burn."  Pt sts "the medication they gave me got everything going."  Pt completed 90% of breakfast tray and sts "I needed something to throw up."

## 2015-08-06 NOTE — ED Notes (Signed)
Pt educated on completed lab work and that all results were WDL.  Pt concerned that numerous residents are getting sick where she lives.

## 2015-10-02 ENCOUNTER — Emergency Department (HOSPITAL_COMMUNITY): Payer: Self-pay

## 2015-10-02 ENCOUNTER — Encounter (HOSPITAL_COMMUNITY): Payer: Self-pay | Admitting: *Deleted

## 2015-10-02 ENCOUNTER — Emergency Department (HOSPITAL_COMMUNITY)
Admission: EM | Admit: 2015-10-02 | Discharge: 2015-10-02 | Disposition: A | Discharge status: Home / Self Care | Payer: Self-pay | Attending: Emergency Medicine | Admitting: Emergency Medicine

## 2015-10-02 DIAGNOSIS — Z79899 Other long term (current) drug therapy: Secondary | ICD-10-CM | POA: Insufficient documentation

## 2015-10-02 DIAGNOSIS — Z3202 Encounter for pregnancy test, result negative: Secondary | ICD-10-CM | POA: Insufficient documentation

## 2015-10-02 DIAGNOSIS — G8929 Other chronic pain: Secondary | ICD-10-CM | POA: Insufficient documentation

## 2015-10-02 DIAGNOSIS — M25552 Pain in left hip: Secondary | ICD-10-CM | POA: Insufficient documentation

## 2015-10-02 DIAGNOSIS — Z8781 Personal history of (healed) traumatic fracture: Secondary | ICD-10-CM | POA: Insufficient documentation

## 2015-10-02 DIAGNOSIS — J45909 Unspecified asthma, uncomplicated: Secondary | ICD-10-CM | POA: Insufficient documentation

## 2015-10-02 DIAGNOSIS — M79605 Pain in left leg: Secondary | ICD-10-CM

## 2015-10-02 DIAGNOSIS — M79652 Pain in left thigh: Secondary | ICD-10-CM | POA: Insufficient documentation

## 2015-10-02 DIAGNOSIS — E119 Type 2 diabetes mellitus without complications: Secondary | ICD-10-CM | POA: Insufficient documentation

## 2015-10-02 LAB — PREGNANCY, URINE: Preg Test, Ur: NEGATIVE

## 2015-10-02 MED ORDER — IBUPROFEN 800 MG PO TABS: 800.0000 mg | ORAL_TABLET | Freq: Four times a day (QID) | ORAL | Status: DC | PRN

## 2015-10-02 MED ORDER — IBUPROFEN 800 MG PO TABS
800.0000 mg | ORAL_TABLET | Freq: Once | ORAL | Status: AC
  Administered 2015-10-02: 800 mg via ORAL
  Filled 2015-10-02: qty 1

## 2015-10-02 NOTE — ED Provider Notes (Signed)
CSN: [id number]     Arrival date & time 10/02/15  1810 History   First MD Initiated Contact with Patient 10/02/15 1840     Chief Complaint  Patient presents with  . Leg Pain     (Consider location/radiation/quality/duration/timing/severity/associated sxs/prior Treatment) HPI Comments: Patient is a 55 year old female with history of left acetabulum and femur fracture who presents with chronic left hip and thigh pain. The patient has had intermittent pain since 2011 she had this injury. Patient points to her groin and mid left thigh when asked to locate her pain. Patient states the pain is worse when she has been walking, standing, going up or down inclines, or laying flat. She has been forced to stand a lot at the [facility] where she is staying currently. Patient states that her pain can be relieved by applying pressure to certain "trigger points" on her thigh and posterior hip. Patient states she has experienced this kind of pain before. She has taken high-dose ibuprofen which does help the pain. Patient was given gabapentin following fracture but had no follow-up. No surgery was indicated at that time according to patient. Patient has been told she may have some associated sciatica. The patient denies any shooting pain however she does state she occasionally has some radiation of pain past her knee. Patient denies any chest pain, shortness of breath, abdominal pain, nausea, vomiting, dysuria. Patient denies any new injury or trauma.  Patient is a 55 y.o. female presenting with leg pain. The history is provided by the patient.  Leg Pain Associated symptoms: no back pain and no fever     Past Medical History  Diagnosis Date  . Asthma   . Diabetes mellitus without complication (HCC)    History reviewed. No pertinent past surgical history. Family History  Problem Relation Age of Onset  . Family history unknown: Yes   Social History  Substance Use Topics  . Smoking status: Never Smoker     . Smokeless tobacco: None  . Alcohol Use: No   OB History    Gravida Para Term Preterm AB TAB SAB Ectopic Multiple Living       Review of Systems  Constitutional: Negative for fever and chills.  HENT: Negative for facial swelling and sore throat.   Respiratory: Negative for shortness of breath.   Cardiovascular: Negative for chest pain.  Gastrointestinal: Negative for nausea, vomiting and abdominal pain.  Genitourinary: Negative for dysuria.  Musculoskeletal: Positive for arthralgias. Negative for back pain.  Skin: Negative for rash and wound.  Neurological: Negative for headaches.  Psychiatric/Behavioral: The patient is not nervous/anxious.       Allergies  Review of patient's allergies indicates no known allergies.  Home Medications   Prior to Admission medications   Medication Sig Start Date End Date Taking? Authorizing Provider  BIOTIN PO Take 2 tablets by mouth daily.   Yes Historical Provider, MD  ibuprofen (ADVIL,MOTRIN) 800 MG tablet Take 1 tablet (800 mg total) by mouth every 6 (six) hours as needed for moderate pain. 10/02/15   Windsor Zirkelbach M Dinah Lupa, PA-C   BP 108/71 mmHg  Pulse 57  Temp(Src) 99 F (37.2 C) (Oral)  Resp 18  SpO2 99%  LMP 09/19/2015 Physical Exam  Constitutional: She appears well-developed and well-nourished. No distress.  HENT:  Head: Normocephalic and atraumatic.  Mouth/Throat: Oropharynx is clear and moist. No oropharyngeal exudate.  Eyes: Conjunctivae are normal. Pupils are equal, round, and reactive to  light. Right eye exhibits no discharge. Left eye exhibits no discharge. No scleral icterus.  Neck: Normal range of motion. Neck supple. No thyromegaly present.  Cardiovascular: Normal rate, regular rhythm and normal heart sounds.  Exam reveals no gallop and no friction rub.   No murmur heard. Pulmonary/Chest: Effort normal and breath sounds normal. No stridor. No respiratory distress. She has no wheezes. She has no rales.   Abdominal: Soft. Bowel sounds are normal. She exhibits no distension. There is no tenderness. There is no rebound and no guarding.  Musculoskeletal: She exhibits no edema.       Legs: Patient states she has trigger points in her mid thigh and inferior femur that she can press to relieve her pain; no calve tenderness bilaterally  Lymphadenopathy:    She has no cervical adenopathy.  Neurological: She is alert. Coordination normal.  Skin: Skin is warm and dry. No rash noted. She is not diaphoretic. No pallor.  Psychiatric: She has a normal mood and affect.  Nursing note and vitals reviewed.   ED Course  Procedures (including critical care time) Labs Review Labs Reviewed  PREGNANCY, URINE    Imaging Review Dg Hip Unilat With Pelvis 2-3 Views Left  10/02/2015  CLINICAL DATA:  Left-sided hip pain initial encounter, no known injury EXAM: DG HIP (WITH OR WITHOUT PELVIS) 2-3V LEFT COMPARISON:  None. FINDINGS: Pelvic ring is intact. No fracture or dislocation is noted. No gross soft tissue abnormality is seen. IMPRESSION: No acute abnormality noted. Electronically Signed   By: Alcide CleverMark  Lukens M.D.   On: 10/02/2015 20:16   Dg Femur Min 2 Views Left  10/02/2015  CLINICAL DATA:  History of prior pelvic fracture several years ago with proximal left femoral pain EXAM: LEFT FEMUR 2 VIEWS COMPARISON:  None. FINDINGS: There is no evidence of fracture or other focal bone lesions. Soft tissues are unremarkable. IMPRESSION: No acute abnormality noted. Electronically Signed   By: Alcide CleverMark  Lukens M.D.   On: 10/02/2015 20:21   I have personally reviewed and evaluated these images and lab results as part of my medical decision-making.   EKG Interpretation None      MDM   Patient presenting with acute on chronic pain. Patient has no insurance and no primary care provider, as she is living in the [facility] at the moment for homelessness. Patient pain improved in ED with fentanyl, and ibuprofen given before  discharge. Urine pregnancy negative. X-rays of left hip and pelvis, femur show no acute abnormality. Patient discharged home with ibuprofen. Patient to follow-up with orthopedics and establish care with primary care provider. Patient given resources of discharge. Discussed strict return precautions. Patient discussed with Dr. Rubin PayorPickering who is in agreement with plan.  Final diagnoses:  Left hip pain  Left leg pain       Emi Holeslexandra M Adline Kirshenbaum, PA-C 10/03/15 0030  Benjiman CoreNathan Pickering, MD 10/04/15 [phone]

## 2015-10-02 NOTE — Progress Notes (Signed)
EDCM called for assistance with medication cost motrin.  Patient listed as not having insurance or a pcp.  Patient is homeless.  [facility].  EDCM unable to assist patient with the cost of motrin due to over the counter medication.  [facility]  [facility]  Patient reports, "That's where I get my motrin."  No further EDCm needs at this time.

## 2015-10-02 NOTE — ED Notes (Signed)
Bed: ZO10WA10 Expected date:  Expected time:  Means of arrival:  Comments: PT still in rm

## 2015-10-02 NOTE — Discharge Instructions (Signed)
Medications: Ibuprofen  Treatment: Take ibuprofen every 6 hours as needed for your hip and leg pain. You may use ice or moist heat, 20 minutes on 20 minutes off, for relief of your hip pain.  Follow-up: Please follow-up with the orthopedic doctor written in your discharge paperwork. Please follow-up with primary care doctor written in your discharge paperwork to establish care and further treat her chronic pain. Please return to the emergency department if you develop any new or worsening symptoms.   Hip Pain Your hip is the joint between your upper legs and your lower pelvis. The bones, cartilage, tendons, and muscles of your hip joint perform a lot of work each day supporting your body weight and allowing you to move around. Hip pain can range from a minor ache to severe pain in one or both of your hips. Pain may be felt on the inside of the hip joint near the groin, or the outside near the buttocks and upper thigh. You may have swelling or stiffness as well.  HOME CARE INSTRUCTIONS   Take medicines only as directed by your health care provider.  Apply ice to the injured area:  Put ice in a plastic bag.  Place a towel between your skin and the bag.  Leave the ice on for 15-20 minutes at a time, 3-4 times a day.  Keep your leg raised (elevated) when possible to lessen swelling.  Avoid activities that cause pain.  Follow specific exercises as directed by your health care provider.  Sleep with a pillow between your legs on your most comfortable side.  Record how often you have hip pain, the location of the pain, and what it feels like. SEEK MEDICAL CARE IF:   You are unable to put weight on your leg.  Your hip is red or swollen or very tender to touch.  Your pain or swelling continues or worsens after 1 week.  You have increasing difficulty walking.  You have a fever. SEEK IMMEDIATE MEDICAL CARE IF:   You have fallen.  You have a sudden increase in pain and swelling in  your hip. MAKE SURE YOU:   Understand these instructions.  Will watch your condition.  Will get help right away if you are not doing well or get worse.   This information is not intended to replace advice given to you by your health care provider. Make sure you discuss any questions you have with your health care provider.   Document Released: 11/18/2009 Document Revised: 06/21/2014 Document Reviewed: 01/25/2013 Elsevier Interactive Patient Education [employer]2016 Elsevier Inc.

## 2015-10-02 NOTE — ED Notes (Signed)
Per EMS, pt is homeless, has been walking all day today.  Reports severe L leg pain.  Hx of L acetabulum and L femur fx from a fall in 2011.  Denies new injury.  Pt is crying.  Received 150mcg fentanyl IVP.

## 2015-10-02 NOTE — ED Notes (Signed)
ED PA at bedside

## 2016-03-16 LAB — GLUCOSE, POCT (MANUAL RESULT ENTRY): POC Glucose: 61 mg/dl — AB (ref 70–99)

## 2016-07-19 ENCOUNTER — Ambulatory Visit: Payer: Self-pay

## 2016-07-21 NOTE — Congregational Nurse Program (Signed)
Congregational Nurse Program Note  Date of Encounter: 07/21/2016  Past Medical History: Past Medical History:  Diagnosis Date  . Asthma   . Diabetes mellitus without complication [facility](HCC)     Encounter Details:     CNP Questionnaire - 07/21/16 1851      Patient Demographics   Is this a new or existing patient? New   Patient is considered a/an Not Applicable   Race African-American/Black     Patient Assistance   Location of Patient Assistance Not Applicable   Patient's financial/insurance status Self-Pay (Uninsured);[employer] Research officer, trade unionCard/Care Connects   Uninsured Patient ([employer] Research officer, trade unionCard/Care Connects) No   Patient referred to apply for the following financial assistance [facility]s Renewal   Food insecurities addressed Not Technical brewerApplicable   Transportation assistance No   Assistance securing medications No   Product/process development scientistducational health offerings Navigating the healthcare system     Encounter Details   Primary purpose of visit Education/Health Concerns;Navigating the Healthcare System;Spiritual Care/Support Visit   Was an Emergency Department visit averted? Not Applicable   Does patient have a medical provider? Yes   Patient referred to Clinic;Follow up with established PCP   Was a mental health screening completed? (GAINS tool) No   Does patient have dental issues? Yes   Was a dental referral made? Yes   Does patient have vision issues? No   Does your patient have an abnormal blood pressure today? No   Since previous encounter, have you referred patient for abnormal blood pressure that resulted in a new diagnosis or medication change? No   Does your patient have an abnormal blood glucose today? No   Since previous encounter, have you referred patient for abnormal blood glucose that resulted in a new diagnosis or medication change? No   Was there a life-saving intervention made? No    Client in after nurse left a note regarding upcoming dental clinics for [facility] .  Gave instructions for client to see NP to get on the list for upcoming  Dental clinics and to come back to see the nurse if it doesn't work out.States something has to be done about her dental care.Linton Ham[place] no relatives here. 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  Blood  Pressure good , 118/76 pulse  68 ,states she is a diabetic on no med's but is diet controlled ??Rambled on about  Breaking her leg and hip and it  Healed on its on . Nurse listened and tried to be supportive.  Follow weekly

## 2016-08-11 NOTE — Congregational Nurse Program (Signed)
Congregational Nurse Program Note  Date of Encounter: 08/10/2016  Past Medical History: Past Medical History:  Diagnosis Date  . Asthma   . Diabetes mellitus without complication [facility](HCC)     Encounter Details:     CNP Questionnaire - 08/11/16 1908      Patient Demographics   Is this a new or existing patient? Existing   Patient is considered a/an Not Applicable   Race African-American/Black     Patient Assistance   Location of Patient Assistance Not Applicable   Patient's financial/insurance status Self-Pay (Uninsured)   Uninsured Patient ([facility] Card/Care Connects) Yes   Interventions Counseled to make appt. with provider;Follow-up/Education/Support provided after completed appt.   Patient referred to apply for the following financial assistance Not Applicable   Food insecurities addressed Not Applicable   Transportation assistance No   Assistance securing medications No   Educational health offerings Navigating the healthcare system     Encounter Details   Primary purpose of visit Navigating the Healthcare System;Education/Health Concerns   Was an Emergency Department visit averted? Not Applicable   Does patient have a medical provider? Yes   Patient referred to Clinic;Follow up with established PCP   Was a mental health screening completed? (GAINS tool) No   Does patient have dental issues? Yes   Was a dental referral made? Yes   Does patient have vision issues? No   Does your patient have an abnormal blood pressure today? No   Since previous encounter, have you referred patient for abnormal blood pressure that resulted in a new diagnosis or medication change? No   Does your patient have an abnormal blood glucose today? No   Since previous encounter, have you referred patient for abnormal blood glucose that resulted in a new diagnosis or medication change? No   Was there a life-saving intervention made? No    Client in with  Concerns  Again about  Her  Dental needs.  States she   Was not  Put on the list  To see the  Dentist  And  Needs to  See a dentist . States her  Pain is  Daily  And  She  Takes  Over the  Counter medications  For it. Nurse will  Make  Contact  With  MAP @ [facility]  To see if  Client can get on list . Tried to reassure  Client that  If  Her  Needs are seen by  NP  She  Will go on the list .  Follow as needed.

## 2016-08-25 NOTE — Congregational Nurse Program (Signed)
Congregational Nurse Program Note  Date of Encounter: 08/25/2016  Past Medical History: Past Medical History:  Diagnosis Date  . Asthma   . Diabetes mellitus without complication [facility])     Encounter Details:     CNP Questionnaire - 08/25/16 1825      Patient Demographics   Is this a new or existing patient? Existing   Patient is considered a/an Not Applicable   Race African-American/Black     Patient Assistance   Location of Patient Assistance Not Applicable   Patient's financial/insurance status Self-Pay (Uninsured)   Uninsured Patient ([facility] Card/Care Connects) Yes   Interventions Counseled to make appt. with provider;Appt. has been completed   Patient referred to apply for the following financial assistance Not Applicable   Food insecurities addressed Not Applicable   Transportation assistance No   Assistance securing medications No   Educational health offerings Navigating the healthcare system     Encounter Details   Primary purpose of visit Acute Illness/Condition Visit;Navigating the Healthcare System;Spiritual Care/Support Visit;Education/Health Concerns   Was an Emergency Department visit averted? Not Applicable   Does patient have a medical provider? Yes   Patient referred to Clinic;Follow up with established PCP   Was a mental health screening completed? (GAINS tool) No   Does patient have dental issues? Yes   Was a dental referral made? Yes   Does your patient have an abnormal blood pressure today? No   Since previous encounter, have you referred patient for abnormal blood pressure that resulted in a new diagnosis or medication change? No   Does your patient have an abnormal blood glucose today? No   Since previous encounter, have you referred patient for abnormal blood glucose that resulted in a new diagnosis or medication change? No   Was there a life-saving intervention made? No    Client back in today . Nurse had  Followed up with NP @ [facility] regarding  client dental needs. Client had missed  Some appointments and was scheduled for 09-15-16 @ 9 am . Client had also missed  Podiatry appointment. Nurse counseled  Client on importance of keeping appointments if she is to hjave her dental needs met . Client will be reminded of  Up coming appointment and gave clienta reminder note today about appointments. Client agrees to keep .

## 2016-08-25 NOTE — Congregational Nurse Program (Signed)
Congregational Nurse Program Note  Date of Encounter: 08/24/2016  Past Medical History: Past Medical History:  Diagnosis Date  . Asthma   . Diabetes mellitus without complication [facility](HCC)     Encounter Details:     CNP Questionnaire - 08/25/16 1816      Encounter Details   Was there a life-saving intervention made? No    Client in regarding her dental needs again stating she  Couldn't  Get  An appointment . Nurse made a [facility]  [facility] and e-mailed  NP @ [facility] to see if client can be seen for dental needs and foot needs. Client seemed a little confused about  Why she was unable  To be seen  . Nurse was supportive  And spend time trying to get  Client to understand . Will follow up.

## 2016-09-08 NOTE — Congregational Nurse Program (Signed)
Congregational Nurse Program Note  Date of Encounter: 09/07/2016  Past Medical History: Past Medical History:  Diagnosis Date  . Asthma   . Diabetes mellitus without complication [facility](HCC)     Encounter Details:     CNP Questionnaire - 09/08/16 1614      Patient Demographics   Is this a new or existing patient? Existing   Patient is considered a/an Not Applicable   Race African-American/Black     Patient Assistance   Location of Patient Assistance Not Applicable   Patient's financial/insurance status Self-Pay (Uninsured)   Uninsured Patient ([employer] Card/Care Connects) Yes   Interventions Follow-up/Education/Support provided after completed appt.;Assisted patient in making appt.;Appt. has been completed   Patient referred to apply for the following financial assistance Not Applicable   Food insecurities addressed Not Applicable   Transportation assistance No   Assistance securing medications No   Educational health offerings Navigating the healthcare system     Encounter Details   Primary purpose of visit Spiritual Care/Support Visit;Education/Health Concerns   Does patient have a medical provider? Yes   Patient referred to Follow up with established PCP   Was a mental health screening completed? (GAINS tool) No   Does patient have dental issues? Yes   Was a dental referral made? No   Does patient have vision issues? No   Does your patient have an abnormal blood pressure today? No   Since previous encounter, have you referred patient for abnormal blood pressure that resulted in a new diagnosis or medication change? No   Does your patient have an abnormal blood glucose today? No   Since previous encounter, have you referred patient for abnormal blood glucose that resulted in a new diagnosis or medication change? No   Was there a life-saving intervention made? No    Client in today wanting  To  Be  Referred to a specialist . Nurse counseled her how  How the process works ,what  the orange  Does  And the fact that she broke her specialist  Appointment. Nurse gave her her appointment reminder again for next week to see NP and told her if she misses that appointment she will not  Be rescheduled and can tbe referred if she doesn't keep her appointments. Hopefully  Client will follow  Through as she agrees. Follow  Weekly.

## 2016-09-21 NOTE — Congregational Nurse Program (Signed)
Congregational Nurse Program Note  Date of Encounter: 09/15/2016  Past Medical History: Past Medical History:  Diagnosis Date  . Asthma   . Diabetes mellitus without complication [facility])     Encounter Details:     CNP Questionnaire - 09/21/16 0020      Patient Demographics   Is this a new or existing patient? Existing   Patient is considered a/an Not Applicable   Race African-American/Black     Patient Assistance   Location of Patient Assistance Not Applicable   Patient's financial/insurance status Self-Pay (Uninsured)   Uninsured Patient ([employer] Research officer, trade union) Yes   Interventions Appt. has been completed   Patient referred to apply for the following financial assistance [employer] insecurities addressed Not Applicable   Transportation assistance No   Assistance securing medications No   Product/process development scientist the healthcare system     Encounter Details   Primary purpose of visit Acute Illness/Condition Visit;Navigating the Healthcare System;Spiritual Care/Support Visit;Education/Health Concerns;Post PCP Visit   Was an Emergency Department visit averted? Not Applicable   Does patient have a medical provider? Yes   Patient referred to Follow up with established PCP;Clinic   Was a mental health screening completed? (GAINS tool) No   Does patient have dental issues? Yes   Was a dental referral made? Yes   Does patient have vision issues? No   Does your patient have an abnormal blood pressure today? No   Since previous encounter, have you referred patient for abnormal blood pressure that resulted in a new diagnosis or medication change? No   Does your patient have an abnormal blood glucose today? No   Since previous encounter, have you referred patient for abnormal blood glucose that resulted in a new diagnosis or medication change? No   Was there a life-saving intervention made? No     Client in to see nurse to let nurse  know that she kept her appointment this am with the NP at  [facility] and was referred for her foot  Problems  And put on the dental list . Thanked  Nurse for her help and nurse stressed importance of keeping those appointments .

## 2016-09-22 NOTE — Congregational Nurse Program (Signed)
Congregational Nurse Program Note  Date of Encounter: 09/21/2016  Past Medical History: Past Medical History:  Diagnosis Date  . Asthma   . Diabetes mellitus without complication [facility])     Encounter Details:     CNP Questionnaire - 09/22/16 1607      Patient Demographics   Patient is considered a/an Not Applicable     Patient Assistance   Location of Patient Assistance Not Applicable   Patient's financial/insurance status Self-Pay (Uninsured)   Uninsured Patient ([employer], trade [employer]) Yes   Interventions Not Applicable   Patient referred to apply for the following financial assistance [employer] insecurities addressed Not Applicable   Transportation assistance No   Assistance securing medications No   Educational health offerings Nutrition     Encounter Details   Primary purpose of visit Education/Health Concerns   Was an Emergency Department visit averted? Not Applicable   Patient referred to Follow up with established PCP   Was a mental health screening completed? (GAINS tool) No   Does patient have dental issues? Yes   Was a dental referral made? No   Does patient have vision issues? No   Does your patient have an abnormal blood pressure today? No   Does your patient have an abnormal blood glucose today? No   Since previous encounter, have you referred patient for abnormal blood glucose that resulted in a new diagnosis or medication change? No   Was there a life-saving intervention made? No    Brief  visit  Client just  Wanted to weigh herself. States she  Must  Do something  About her weight,counseled briefly regarding exercise and nutrition.   Follow weekly

## 2016-11-03 ENCOUNTER — Emergency Department (HOSPITAL_COMMUNITY)
Admission: EM | Admit: 2016-11-03 | Discharge: 2016-11-03 | Disposition: A | Discharge status: Home / Self Care | Payer: Self-pay | Attending: Emergency Medicine | Admitting: Emergency Medicine

## 2016-11-03 ENCOUNTER — Encounter (HOSPITAL_COMMUNITY): Payer: Self-pay | Admitting: Emergency Medicine

## 2016-11-03 DIAGNOSIS — E119 Type 2 diabetes mellitus without complications: Secondary | ICD-10-CM | POA: Insufficient documentation

## 2016-11-03 DIAGNOSIS — T7840XA Allergy, unspecified, initial encounter: Secondary | ICD-10-CM

## 2016-11-03 DIAGNOSIS — T781XXA Other adverse food reactions, not elsewhere classified, initial encounter: Secondary | ICD-10-CM | POA: Insufficient documentation

## 2016-11-03 DIAGNOSIS — J45909 Unspecified asthma, uncomplicated: Secondary | ICD-10-CM | POA: Insufficient documentation

## 2016-11-03 MED ORDER — DIPHENHYDRAMINE HCL 50 MG/ML IJ SOLN
50.0000 mg | Freq: Once | INTRAMUSCULAR | Status: AC
  Administered 2016-11-03: 50 mg via INTRAVENOUS
  Filled 2016-11-03: qty 1

## 2016-11-03 MED ORDER — METHYLPREDNISOLONE SODIUM SUCC 125 MG IJ SOLR
125.0000 mg | Freq: Once | INTRAMUSCULAR | Status: AC
  Administered 2016-11-03: 125 mg via INTRAVENOUS
  Filled 2016-11-03: qty 2

## 2016-11-03 MED ORDER — PREDNISONE 50 MG PO TABS: ORAL_TABLET | ORAL | 0 refills | Status: DC

## 2016-11-03 MED ORDER — EPINEPHRINE 0.3 MG/0.3ML IJ SOAJ: 0.3000 mg | Freq: Once | INTRAMUSCULAR | 2 refills | Status: AC

## 2016-11-03 MED ORDER — FAMOTIDINE IN NACL 20-0.9 MG/50ML-% IV SOLN
20.0000 mg | Freq: Once | INTRAVENOUS | Status: AC
  Administered 2016-11-03: 20 mg via INTRAVENOUS
  Filled 2016-11-03: qty 50

## 2016-11-03 NOTE — ED Notes (Signed)
Pt did not need anything at this time  

## 2016-11-03 NOTE — Discharge Instructions (Signed)
Use Benadryl as directed for itching °

## 2016-11-03 NOTE — ED Triage Notes (Signed)
Pt here after eating shrimp at 1100; pt with generalized hives and some swelling to lip; pt sts mild SOB

## 2016-11-03 NOTE — ED Provider Notes (Signed)
[facility] Provider Note   CSN: [id number] Arrival date & time: 11/03/16  1300     History   Chief Complaint Chief Complaint  Patient presents with  . Allergic Reaction  . Facial Swelling    HPI [name] is a 56 y.o. female.  [age] year old female presents with acute onset of hives to her face after eating shrimp approximately one hour prior to arrival. Denies any trouble swallowing. Nose does not have any dyspnea. Does note some pruritus. States she has mild dyspnea. No prior history of allergic reactions to shellfish. No treatment use prior to arrival nothing makes her symptoms better.      Past Medical History:  Diagnosis Date  . Asthma   . Diabetes mellitus without complication [facility](HCC)     Patient Active Problem List   Diagnosis Date Noted  . Unspecified mood (affective) disorder (HCC) 07/10/2015  . Anxiety     History reviewed. No pertinent surgical history.  OB History    Gravida Para Term Preterm AB Living   0 0 0 0 0 0   SAB TAB Ectopic Multiple Live Births   0 0 0 0         Home Medications    Prior to Admission medications   Medication Sig Start Date End Date Taking? Authorizing Provider  BIOTIN PO Take 2 tablets by mouth daily.    [provider]  ibuprofen (ADVIL,MOTRIN) 800 MG tablet Take 1 tablet (800 mg total) by mouth every 6 (six) hours as needed for moderate pain. 10/02/15   Emi HolesLaw, Alexandra M, PA-C    Family History Family History  Problem Relation Age of Onset  . Family history unknown: Yes    Social History Social History  Substance Use Topics  . Smoking status: Never Smoker  . Smokeless tobacco: Not on file  . Alcohol use No     Allergies   Shellfish allergy   Review of Systems Review of Systems  All other systems reviewed and are negative.    Physical Exam Updated Vital Signs BP 126/86 (BP Location: Right Arm)   Pulse 80   Temp 99.1 F (37.3 C) (Oral)   Resp 12   SpO2 96%   Physical Exam    Constitutional: She is oriented to person, place, and time. She appears well-developed and well-nourished.  Non-toxic appearance. No distress.  HENT:  Head: Normocephalic and atraumatic.  Eyes: Conjunctivae, EOM and lids are normal. Pupils are equal, round, and reactive to light.  Neck: Normal range of motion. Neck supple. No tracheal deviation present. No thyroid mass present.  Cardiovascular: Normal rate, regular rhythm and normal heart sounds.  Exam reveals no gallop.   No murmur heard. Pulmonary/Chest: Effort normal and breath sounds normal. No stridor. No respiratory distress. She has no decreased breath sounds. She has no wheezes. She has no rhonchi. She has no rales.  Abdominal: Soft. Normal appearance and bowel sounds are normal. She exhibits no distension. There is no tenderness. There is no rebound and no CVA tenderness.  Musculoskeletal: Normal range of motion. She exhibits no edema or tenderness.  Neurological: She is alert and oriented to person, place, and time. She has normal strength. No cranial nerve deficit or sensory deficit. GCS eye subscore is 4. GCS verbal subscore is 5. GCS motor subscore is 6.  Skin: Skin is warm and dry. Rash noted. No abrasion noted. Rash is urticarial.     Psychiatric: She has a normal mood and affect. Her speech is normal  and behavior is normal.  Nursing note and vitals reviewed.    ED Treatments / Results  Labs (all labs ordered are listed, but only abnormal results are displayed) Labs Reviewed - No data to display  EKG  EKG Interpretation None       Radiology No results found.  Procedures Procedures (including critical care time)  Medications Ordered in ED Medications  famotidine (PEPCID) IVPB 20 mg premix (20 mg Intravenous New Bag/Given 11/03/16 1323)  methylPREDNISolone sodium succinate (SOLU-MEDROL) 125 mg/2 mL injection 125 mg (125 mg Intravenous Given 11/03/16 1323)  diphenhydrAMINE (BENADRYL) injection 50 mg (50 mg  Intravenous Given 11/03/16 1324)     Initial Impression / Assessment and Plan / ED Course  I have reviewed the triage vital signs and the nursing notes.  Pertinent labs & imaging results that were available during my care of the patient were reviewed by me and considered in my medical decision making (see chart for details).     Patient treated with Benadryl, Solu-Medrol, Pepcid and her highs greatly improved. Patient encouraged to avoid shellfish and will be given epinephrine pen prescription.  Final Clinical Impressions(s) / ED Diagnoses   Final diagnoses:  None    New Prescriptions New Prescriptions   No medications on file     Lorre Nick, MD 11/03/16 1514

## 2016-11-03 NOTE — ED Notes (Signed)
Patient given Malaw[place]key sandwich. Tolerating well.

## 2016-12-21 NOTE — Congregational Nurse Program (Signed)
Congregational Nurse Program Note  Date of Encounter: 12/21/2016  Past Medical History: Past Medical History:  Diagnosis Date  . Asthma   . Diabetes mellitus without complication [facility](HCC)     Encounter Details:     CNP Questionnaire - 12/21/16 1724      Patient Demographics   Is this a new or existing patient? Existing   Patient is considered a/an Not Applicable   Race African-American/Black     Patient Assistance   Location of Patient Assistance Not Applicable   Patient's financial/insurance status Self-Pay (Uninsured)   Uninsured Patient ([employer] Connects) Yes   Interventions Follow-up/Education/Support provided after completed appt.   Patient referred to apply for the following financial assistance [employer] Card/Care Connects Renewal   Food insecurities addressed Not Applicable   Transportation assistance No   Assistance securing medications No   Educational health offerings Nutrition;Acute disease     Encounter Details   Primary purpose of visit Acute Illness/Condition Visit;Navigating the Healthcare System;Spiritual Care/Support Visit;Education/Health Concerns   Was an Emergency Department visit averted? Not Applicable   Does patient have a medical provider? Yes   Patient referred to Clinic;Area Agency;Follow up with established PCP   Was a mental health screening completed? (GAINS tool) No   Does patient have dental issues? Yes   Was a dental referral made? Yes   Does patient have vision issues? No   Does your patient have an abnormal blood pressure today? No   Since previous encounter, have you referred patient for abnormal blood pressure that resulted in a new diagnosis or medication change? No   Does your patient have an abnormal blood glucose today? No   Since previous encounter, have you referred patient for abnormal blood glucose that resulted in a new diagnosis or medication change? No   Was there a life-saving intervention made? No    Client in to see  nurse still complaining about her dental needs . Nurse will refer to free dental clinic ,[facility] will wait to see if any appointment slots are available and let client know . Referral completed pending available appointment slot.

## 2017-02-22 ENCOUNTER — Ambulatory Visit (INDEPENDENT_AMBULATORY_CARE_PROVIDER_SITE_OTHER): Payer: Self-pay | Admitting: Orthopaedic Surgery

## 2017-03-31 ENCOUNTER — Telehealth (INDEPENDENT_AMBULATORY_CARE_PROVIDER_SITE_OTHER): Payer: Self-pay | Admitting: Orthopaedic Surgery

## 2017-03-31 NOTE — Telephone Encounter (Signed)
Faxed to Narda Bondsynthia [facility] informing patient hasn't been seen yet. Is now scheduled for 11/5 [phone]

## 2017-04-18 ENCOUNTER — Ambulatory Visit (INDEPENDENT_AMBULATORY_CARE_PROVIDER_SITE_OTHER): Payer: Medicaid Other

## 2017-04-18 ENCOUNTER — Ambulatory Visit (INDEPENDENT_AMBULATORY_CARE_PROVIDER_SITE_OTHER): Payer: Medicaid Other | Admitting: Orthopaedic Surgery

## 2017-04-18 ENCOUNTER — Encounter (INDEPENDENT_AMBULATORY_CARE_PROVIDER_SITE_OTHER): Payer: Self-pay | Admitting: Orthopaedic Surgery

## 2017-04-18 DIAGNOSIS — M48062 Spinal stenosis, lumbar region with neurogenic claudication: Secondary | ICD-10-CM

## 2017-04-18 DIAGNOSIS — M5416 Radiculopathy, lumbar region: Secondary | ICD-10-CM | POA: Diagnosis not present

## 2017-04-18 NOTE — Progress Notes (Signed)
Office Visit Note   Patient: Ashley Hunt           Date of Birth: 11-04-1960           MRN: [id number] Visit Date: 04/18/2017              Requested by: Lavinia SharpsPlacey, Mary Ann, NP [address] [place] [address] PCP: Lavinia SharpsPlacey, Mary Ann, NP   Assessment & Plan: Visit Diagnoses:  1. Lumbar radiculopathy   2. Spinal stenosis of lumbar region with neurogenic claudication     Plan: Impression is low back pain with suspected spinal stenosis.  Recommend MRI to evaluate for this.  Follow-up after the MRI.  Follow-Up Instructions: Return in about 2 weeks (around 05/02/2017).   Orders:  Orders Placed This Encounter  Procedures  . XR Lumbar Spine 2-3 Views  . MR Lumbar Spine w/o contrast   No orders of the defined types were placed in this encounter.     Procedures: No procedures performed   Clinical Data: No additional findings.   Subjective: Chief Complaint  Patient presents with  . Left Hip - Pain    Patient is a 56 year old female comes in with left hip and leg pain that comes and goes since 2015 status post mechanical fall.  She states that she had fractured her pelvis and acetabulum at that time but did not require surgery.  She denies any numbness and tingling but she does endorse radicular pain.  She also endorses inability to walk more than 5 city blocks due to severe pain in her back and legs.  Denies any bowel or bladder dysfunction.    Review of Systems  Constitutional: Negative.   HENT: Negative.   Eyes: Negative.   Respiratory: Negative.   Cardiovascular: Negative.   Endocrine: Negative.   Musculoskeletal: Negative.   Neurological: Negative.   Hematological: Negative.   Psychiatric/Behavioral: Negative.   All other systems reviewed and are negative.    Objective: Vital Signs: There were no vitals taken for this visit.  Physical Exam  Constitutional: She is oriented to person, place, and time. She appears well-developed and  well-nourished.  HENT:  Head: Normocephalic and atraumatic.  Eyes: EOM are normal.  Neck: Neck supple.  Pulmonary/Chest: Effort normal.  Abdominal: Soft.  Neurological: She is alert and oriented to person, place, and time.  Skin: Skin is warm. Capillary refill takes less than 2 seconds.  Psychiatric: She has a normal mood and affect. Her behavior is normal. Judgment and thought content normal.  Nursing note and vitals reviewed.   Ortho Exam Left lower extremity exam shows painless rotation of the hip.  Negative Stinchfield sign.  No sciatic tension signs.  Lateral hip is nontender.  Negative Faber test.  She has slight weakness in hip flexion and knee extension secondary to pain. Specialty Comments:  No specialty comments available.  Imaging: No results found.   PMFS History: Patient Active Problem List   Diagnosis Date Noted  . Lumbar radiculopathy 04/18/2017  . Spinal stenosis of lumbar region with neurogenic claudication 04/18/2017  . Unspecified mood (affective) disorder (HCC) 07/10/2015  . Anxiety    Past Medical History:  Diagnosis Date  . Asthma   . Diabetes mellitus without complication (HCC)     Family History  Family history unknown: Yes    History reviewed. No pertinent surgical history. Social History   Occupational History  . Not on file  Tobacco Use  . Smoking status: Never Smoker  Substance and Sexual  Activity  . Alcohol use: No  . Drug use: No  . Sexual activity: No

## 2017-04-29 ENCOUNTER — Other Ambulatory Visit: Payer: Self-pay

## 2017-05-02 ENCOUNTER — Other Ambulatory Visit: Payer: Self-pay

## 2017-05-02 ENCOUNTER — Emergency Department (HOSPITAL_COMMUNITY)
Admission: EM | Admit: 2017-05-02 | Discharge: 2017-05-02 | Disposition: A | Discharge status: Home / Self Care | Payer: Medicaid Other | Attending: Emergency Medicine | Admitting: Emergency Medicine

## 2017-05-02 ENCOUNTER — Encounter (HOSPITAL_COMMUNITY): Payer: Self-pay | Admitting: *Deleted

## 2017-05-02 ENCOUNTER — Ambulatory Visit (INDEPENDENT_AMBULATORY_CARE_PROVIDER_SITE_OTHER): Payer: Self-pay | Admitting: Orthopaedic Surgery

## 2017-05-02 DIAGNOSIS — M25552 Pain in left hip: Secondary | ICD-10-CM | POA: Diagnosis not present

## 2017-05-02 DIAGNOSIS — K0889 Other specified disorders of teeth and supporting structures: Secondary | ICD-10-CM | POA: Insufficient documentation

## 2017-05-02 DIAGNOSIS — Z5321 Procedure and treatment not carried out due to patient leaving prior to being seen by health care provider: Secondary | ICD-10-CM | POA: Insufficient documentation

## 2017-05-02 NOTE — ED Triage Notes (Signed)
Pt reports having a broken tooth on left lower side that is causing pain. Airway intact. Also has left hip pain that has been ongoing, no injury.

## 2017-05-02 NOTE — ED Notes (Signed)
States that she doesn't want to wait anymore

## 2017-05-12 ENCOUNTER — Ambulatory Visit
Admission: RE | Admit: 2017-05-12 | Discharge: 2017-05-12 | Disposition: A | Discharge status: Home / Self Care | Payer: Self-pay | Source: Ambulatory Visit | Attending: Orthopaedic Surgery | Admitting: Orthopaedic Surgery

## 2017-05-12 DIAGNOSIS — M5416 Radiculopathy, lumbar region: Secondary | ICD-10-CM

## 2017-05-12 DIAGNOSIS — M48062 Spinal stenosis, lumbar region with neurogenic claudication: Secondary | ICD-10-CM

## 2017-05-16 ENCOUNTER — Ambulatory Visit (INDEPENDENT_AMBULATORY_CARE_PROVIDER_SITE_OTHER): Payer: Self-pay | Admitting: Orthopaedic Surgery

## 2017-05-24 ENCOUNTER — Ambulatory Visit (INDEPENDENT_AMBULATORY_CARE_PROVIDER_SITE_OTHER): Payer: Self-pay | Admitting: Orthopaedic Surgery

## 2017-05-24 ENCOUNTER — Encounter (INDEPENDENT_AMBULATORY_CARE_PROVIDER_SITE_OTHER): Payer: Self-pay

## 2017-05-25 ENCOUNTER — Telehealth (INDEPENDENT_AMBULATORY_CARE_PROVIDER_SITE_OTHER): Payer: Self-pay | Admitting: Orthopaedic Surgery

## 2017-05-25 NOTE — Telephone Encounter (Signed)
[facility] [phone]

## 2017-06-20 ENCOUNTER — Encounter (INDEPENDENT_AMBULATORY_CARE_PROVIDER_SITE_OTHER): Payer: Self-pay

## 2017-06-20 ENCOUNTER — Ambulatory Visit (INDEPENDENT_AMBULATORY_CARE_PROVIDER_SITE_OTHER): Payer: Self-pay | Admitting: Orthopaedic Surgery

## 2017-07-22 ENCOUNTER — Telehealth (HOSPITAL_COMMUNITY): Payer: Self-pay

## 2017-07-22 NOTE — Telephone Encounter (Signed)
Phoned patient many times to attempt to schedule with [facility] left message and no answer. No return calls.

## 2017-07-25 ENCOUNTER — Ambulatory Visit (INDEPENDENT_AMBULATORY_CARE_PROVIDER_SITE_OTHER): Payer: Medicaid Other | Admitting: Orthopaedic Surgery

## 2017-07-25 DIAGNOSIS — M5416 Radiculopathy, lumbar region: Secondary | ICD-10-CM | POA: Diagnosis not present

## 2017-07-25 NOTE — Progress Notes (Signed)
   Office Visit Note   Patient: Ashley Hunt           Date of Birth: Oct 19, 1960           MRN: [id number] Visit Date: 07/25/2017              Requested by: Lavinia SharpsPlacey, Mary Ann, NP [address] [place], [place] [address] PCP: Lavinia SharpsPlacey, Mary Ann, NP   Assessment & Plan: Visit Diagnoses:  1. Lumbar radiculopathy     Plan: MRI is consistent with left-sided L4-L5 stenosis.  Recommend epidural steroid injection with Dr. Alvester MorinNewton. Total face to face encounter time was greater than 25 minutes and over half of this time was spent in counseling and/or coordination of care.  Follow-Up Instructions: Return if symptoms worsen or fail to improve.   Orders:  Orders Placed This Encounter  Procedures  . Ambulatory referral to [facility] Medicine Rehab   No orders of the defined types were placed in this encounter.     Procedures: No procedures performed   Clinical Data: No additional findings.   Subjective: Chief Complaint  Patient presents with  . Lower Back - Pain, Follow-up    MRI review    Patient is here to review her lumbar spine MRI.  Stable exam    Review of Systems   Objective: Vital Signs: There were no vitals taken for this visit.  Physical Exam  Ortho Exam  stable exam Specialty Comments:  No specialty comments available.  Imaging: No results found.   PMFS History: Patient Active Problem List   Diagnosis Date Noted  . Lumbar radiculopathy 04/18/2017  . Spinal stenosis of lumbar region with neurogenic claudication 04/18/2017  . Unspecified mood (affective) disorder (HCC) 07/10/2015  . Anxiety    Past Medical History:  Diagnosis Date  . Asthma   . Diabetes mellitus without complication (HCC)     Family History  Family history unknown: Yes    No past surgical history on file. Social History   Occupational History  . Not on file  Tobacco Use  . Smoking status: Never Smoker  Substance and Sexual Activity  . Alcohol use: No  . Drug use: No   . Sexual activity: No

## 2017-09-05 ENCOUNTER — Encounter (INDEPENDENT_AMBULATORY_CARE_PROVIDER_SITE_OTHER): Payer: Self-pay | Admitting: Physical Medicine and Rehabilitation

## 2017-09-19 ENCOUNTER — Encounter (INDEPENDENT_AMBULATORY_CARE_PROVIDER_SITE_OTHER): Payer: Self-pay | Admitting: Physical Medicine and Rehabilitation

## 2017-09-19 ENCOUNTER — Ambulatory Visit (INDEPENDENT_AMBULATORY_CARE_PROVIDER_SITE_OTHER): Payer: Medicaid Other | Admitting: Physical Medicine and Rehabilitation

## 2017-09-19 ENCOUNTER — Encounter (HOSPITAL_COMMUNITY): Payer: Self-pay | Admitting: Emergency Medicine

## 2017-09-19 ENCOUNTER — Ambulatory Visit (HOSPITAL_COMMUNITY)
Admission: EM | Admit: 2017-09-19 | Discharge: 2017-09-19 | Disposition: A | Discharge status: Home / Self Care | Payer: Medicaid Other | Attending: Family Medicine | Admitting: Family Medicine

## 2017-09-19 VITALS — BP 144/88 | HR 78 | Temp 97.6°F

## 2017-09-19 DIAGNOSIS — M47816 Spondylosis without myelopathy or radiculopathy, lumbar region: Secondary | ICD-10-CM | POA: Diagnosis not present

## 2017-09-19 DIAGNOSIS — R609 Edema, unspecified: Secondary | ICD-10-CM | POA: Diagnosis not present

## 2017-09-19 DIAGNOSIS — G8929 Other chronic pain: Secondary | ICD-10-CM

## 2017-09-19 DIAGNOSIS — R600 Localized edema: Secondary | ICD-10-CM | POA: Diagnosis not present

## 2017-09-19 DIAGNOSIS — M25571 Pain in right ankle and joints of right foot: Secondary | ICD-10-CM | POA: Diagnosis not present

## 2017-09-19 DIAGNOSIS — B372 Candidiasis of skin and nail: Secondary | ICD-10-CM

## 2017-09-19 DIAGNOSIS — M545 Low back pain, unspecified: Secondary | ICD-10-CM

## 2017-09-19 LAB — POCT I-STAT, CHEM 8
BUN: 17 mg/dL (ref 6–20)
CHLORIDE: 101 mmol/L (ref 101–111)
CREATININE: 0.8 mg/dL (ref 0.44–1.00)
Calcium, Ion: 1.19 mmol/L (ref 1.15–1.40)
GLUCOSE: 80 mg/dL (ref 65–99)
HEMATOCRIT: 37 % (ref 36.0–46.0)
Hemoglobin: 12.6 g/dL (ref 12.0–15.0)
Potassium: 3.8 mmol/L (ref 3.5–5.1)
Sodium: 140 mmol/L (ref 135–145)
TCO2: 27 mmol/L (ref 22–32)

## 2017-09-19 MED ORDER — FUROSEMIDE 20 MG PO TABS: 20.0000 mg | ORAL_TABLET | Freq: Every day | ORAL | 0 refills | Status: DC

## 2017-09-19 MED ORDER — NYSTATIN 100000 UNIT/GM EX CREA: 1.0000 "application " | TOPICAL_CREAM | Freq: Two times a day (BID) | CUTANEOUS | 0 refills | Status: DC

## 2017-09-19 NOTE — Progress Notes (Signed)
 .  Numeric Pain Rating Scale and Functional Assessment Average Pain 4   In the last MONTH (on 0-10 scale) has pain interfered with the following?  1. General activity like being  able to carry out your everyday physical activities such as walking, climbing stairs, carrying groceries, or moving a chair?  Rating(4)   +Driver(bus), -BT, -Dye Allergies.
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
## 2017-09-19 NOTE — ED Triage Notes (Signed)
Pt sts bilateral leg swelling x 4 days

## 2017-09-19 NOTE — Discharge Instructions (Signed)
Use of ace wraps to lower legs while they are dependent.  Please elevate legs as able to help with swelling. Three days of lasix, this will increase how much you urinate. Cream twice a day to breast rash until resolution. Please follow up at [facility] for recheck.

## 2017-09-23 ENCOUNTER — Encounter (INDEPENDENT_AMBULATORY_CARE_PROVIDER_SITE_OTHER): Payer: Self-pay | Admitting: Physical Medicine and Rehabilitation
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
## 2017-09-26 ENCOUNTER — Ambulatory Visit (INDEPENDENT_AMBULATORY_CARE_PROVIDER_SITE_OTHER): Payer: Self-pay | Admitting: Orthopaedic Surgery

## 2017-10-03 ENCOUNTER — Ambulatory Visit (INDEPENDENT_AMBULATORY_CARE_PROVIDER_SITE_OTHER): Payer: Self-pay | Admitting: Physician Assistant

## 2017-10-13 ENCOUNTER — Ambulatory Visit (INDEPENDENT_AMBULATORY_CARE_PROVIDER_SITE_OTHER): Payer: Self-pay | Admitting: Orthopaedic Surgery

## 2017-10-25 ENCOUNTER — Ambulatory Visit: Payer: Self-pay | Admitting: Internal Medicine

## 2017-10-25 ENCOUNTER — Encounter

## 2018-09-29 ENCOUNTER — Encounter: Payer: Self-pay | Admitting: *Deleted

## 2018-09-29 NOTE — Congregational Nurse Program (Signed)
COVID-19 HOTEL SCREEN Patient reports needs help but did not disclose what type of help.

## 2018-10-04 ENCOUNTER — Telehealth: Payer: Self-pay | Admitting: Pediatric Intensive Care

## 2018-10-04 NOTE — Telephone Encounter (Signed)
Referral call to client. She is a patient at [facility] clinic and has appointment on 4/28. She does not need assistance with transportation. She does not require any further case management at this time. Shann Medal RN BSN CNP [phone]

## 2018-10-08 NOTE — Progress Notes (Signed)
COVID Hotel Screening performed. Temperature, PHQ-9, and need for medical care and medications assessed. No needs assessed at this time.  Kipper Buch RN MSN 

## 2018-10-13 NOTE — Progress Notes (Signed)
COVID Hotel Screening performed. Temperature, PHQ-9, and need for medical care and medications assessed. No additional needs assessed at this time.  Kyrianna Barletta RN MSN 

## 2018-10-16 NOTE — Progress Notes (Signed)
COVID Hotel Screening performed. Temperature, PHQ-9, and need for medical care and medications assessed. No additional needs at this time.  Finnleigh Marchetti RN MSN 

## 2018-10-16 NOTE — Progress Notes (Signed)
Closing encounter per request. 

## 2018-10-20 NOTE — Progress Notes (Signed)
COVID Hotel Screening performed. Temperature, PHQ-9, and need for medical care and medications assessed. No additional needs assessed at this time.  Samyah Bilbo RN MSN 

## 2018-10-27 ENCOUNTER — Encounter: Payer: Self-pay | Admitting: *Deleted

## 2018-10-27 NOTE — Progress Notes (Signed)
COVID Hotel Screening performed. Temperature, PHQ-9, and need for medical care and medications assessed. No additional needs assessed at this time.   [name] 

## 2018-10-29 NOTE — Progress Notes (Signed)
COVID Hotel Screening performed. Temperature, PHQ-9, and need for medical care and medications assessed. No additional needs assessed at this time.  Jayesh Marbach RN MSN 

## 2018-11-03 NOTE — Progress Notes (Signed)
COVID Hotel Screening performed. Temperature, PHQ-9, and need for medical care and medications assessed. Patient has diabetes and was supposed to receive a glucometer, but has not received it yet. Patient is being cared for by [name] NP. Patient was encouraged to reach out to Chales Abrahams to discuss. Patient agreed to do so.  Carlyle Basques RN MSN

## 2018-11-07 ENCOUNTER — Telehealth: Payer: Self-pay | Admitting: *Deleted

## 2018-11-07 DIAGNOSIS — Z20822 Contact with and (suspected) exposure to covid-19: Secondary | ICD-10-CM

## 2018-11-07 NOTE — Telephone Encounter (Signed)
Order placed for COVID-19 testing.  

## 2018-11-10 NOTE — Progress Notes (Signed)
COVID Hotel Screening performed. Temperature, PHQ-9, and need for medical care and medications assessed. Patient agreed to the COVID-19 testing. No additional needs assessed at this time.  Jayziah Bankhead RN MSN 

## 2018-11-15 LAB — NOVEL CORONAVIRUS, NAA: SARS-CoV-2, NAA: NOT DETECTED

## 2018-11-16 ENCOUNTER — Telehealth: Payer: Self-pay

## 2018-11-16 ENCOUNTER — Encounter: Payer: Self-pay | Admitting: *Deleted

## 2018-11-16 NOTE — Telephone Encounter (Signed)
Attempt made to give COVID results to patient. No answer LVM to return call.

## 2018-11-26 NOTE — Progress Notes (Signed)
COVID Hotel Screening performed. Temperature, PHQ-9, and need for medical care and medications assessed. No additional needs assessed at this time.  Arnett Galindez  MSN, RN 

## 2019-01-16 NOTE — Progress Notes (Signed)
COVID-19 Screening performed. Temperature, PHQ-9, and medication assessment. Patient reports being on several medications that are about to run out and he does not have access to obtain additional medications. Referred to Audrea Muscat NP.   Jobe Igo MSN, RN

## 2019-02-09 NOTE — Progress Notes (Signed)
COVID-19 Screening performed. Temperature, PHQ-9, and need for medical care and medications assessed. No additional needs assessed at this time.  Naidelin Gugliotta MSN, RN 

## 2019-02-12 NOTE — Progress Notes (Signed)
COVID-19 Screening performed. Temperature, PHQ-9, and need for medical care and medications assessed. No additional needs assessed at this time.  Nayla Dias MSN, RN 

## 2019-03-20 ENCOUNTER — Ambulatory Visit (HOSPITAL_COMMUNITY)
Admission: EM | Admit: 2019-03-20 | Discharge: 2019-03-20 | Disposition: A | Discharge status: Home / Self Care | Payer: Self-pay

## 2019-03-20 ENCOUNTER — Other Ambulatory Visit: Payer: Self-pay

## 2019-03-20 ENCOUNTER — Telehealth (HOSPITAL_COMMUNITY): Payer: Self-pay

## 2019-03-20 ENCOUNTER — Encounter (HOSPITAL_COMMUNITY): Payer: Self-pay

## 2019-03-20 ENCOUNTER — Ambulatory Visit (INDEPENDENT_AMBULATORY_CARE_PROVIDER_SITE_OTHER): Payer: Self-pay

## 2019-03-20 DIAGNOSIS — M25571 Pain in right ankle and joints of right foot: Secondary | ICD-10-CM

## 2019-03-20 DIAGNOSIS — M25561 Pain in right knee: Secondary | ICD-10-CM

## 2019-03-20 DIAGNOSIS — M25469 Effusion, unspecified knee: Secondary | ICD-10-CM

## 2019-03-20 MED ORDER — PREDNISONE 10 MG (21) PO TBPK: ORAL_TABLET | Freq: Every day | ORAL | 0 refills | Status: DC

## 2019-03-20 MED ORDER — PREDNISONE 10 MG (21) PO TBPK: ORAL_TABLET | ORAL | 0 refills | Status: DC

## 2019-03-20 NOTE — Discharge Instructions (Addendum)
No acute problem on the x ray You do have some chronic stuff that could cause pain.  You have some bone spurring and flat foot deformity.  Also some bone spurring in the lower leg which could cause pain and nerve problems.  We can give you some prednisone today that may help with some of the pain and inflammation. You need to see an orthopedist.  Rest the leg, Ice the knee and elevate.

## 2019-03-20 NOTE — ED Triage Notes (Signed)
Pt presents to UC w/ c/o swelling in lower legs and pain in legs/feet x5 days. Pt states she hasn't been able to refill her arthritis med.

## 2019-03-20 NOTE — ED Provider Notes (Signed)
[facility]    CSN: [id number] Arrival date & time: 03/20/19  1213      History   Chief Complaint Chief Complaint  Patient presents with   lower leg swelling/knee pain    HPI Ashley Hunt is a 58 y.o. female.   Patient is a 58 year old female past medical history of asthma and diabetes.  She is presenting today with chronic pain.  Patient reporting bilateral knee pain, swelling along with bilateral ankle and foot pain.  Does a lot of walking due to lack of transportation.  Symptoms have worsened over the last 5 days.  Typically takes cyclobenzaprine and ibuprofen for her symptoms.  Reporting chronic back pain due to injury years ago.  Currently trying to get on disability due to inability to work.  Family history of rheumatoid arthritis.  No history of DVTs, PEs, sedentary lifestyle, recent traveling.  She does not smoke.  No fever, chills, body aches.  No urinary symptoms, loss of bowel or bladder control.  No saddle paresthesias or weakness in lower extremities.  ROS per HPI      Past Medical History:  Diagnosis Date   Asthma    Diabetes mellitus without complication [facility])     Patient Active Problem List   Diagnosis Date Noted   Lumbar radiculopathy 04/18/2017   Spinal stenosis of lumbar region with neurogenic claudication 04/18/2017   Unspecified mood (affective) disorder ([place]) 07/10/2015   Anxiety     History reviewed. No pertinent surgical history.  OB History    Gravida  0   Para  0   Term  0   Preterm  0   AB  0   Living  0     SAB  0   TAB  0   Ectopic  0   Multiple  0   Live Births               Home Medications    Prior to Admission medications   Medication Sig Start Date End Date Taking? Authorizing Provider  ARIPiprazole (ABILIFY) 10 MG tablet Take 10 mg by mouth daily.   Yes [provider]  cyclobenzaprine (FLEXERIL) 5 MG tablet Take 5 mg by mouth 3 (three) times daily as needed for muscle  spasms.   Yes [provider]  ibuprofen (ADVIL,MOTRIN) 800 MG tablet Take 1 tablet (800 mg total) by mouth every 6 (six) hours as needed for moderate pain. 10/02/15   Law, Bea Graff, PA-C  predniSONE (STERAPRED UNI-PAK 21 TAB) 10 MG (21) TBPK tablet 6 tabs for 1 day, then 5 tabs for 1 das, then 4 tabs for 1 day, then 3 tabs for 1 day, 2 tabs for 1 day, then 1 tab for 1 day 03/20/19   Loura Halt A, NP  furosemide (LASIX) 20 MG tablet Take 1 tablet (20 mg total) by mouth daily for 3 days. 09/19/17 03/20/19  Zigmund Gottron, NP    Family History Family History  Family history unknown: Yes    Social History Social History   Tobacco Use   Smoking status: Never Smoker   Smokeless tobacco: Never Used  Substance Use Topics   Alcohol use: No   Drug use: No     Allergies   Shellfish allergy   Review of Systems Review of Systems   Physical Exam Triage Vital Signs ED Triage Vitals  Enc Vitals Group     BP 03/20/19 1244 109/79     Pulse Rate 03/20/19 1244 95  Resp 03/20/19 1244 16     Temp 03/20/19 1244 97.9 F (36.6 C)     Temp Source 03/20/19 1244 Oral     SpO2 03/20/19 1244 97 %     Weight --      Height --      Head Circumference --      Peak Flow --      Pain Score 03/20/19 1252 9     Pain Loc --      Pain Edu? --      Excl. in GC? --    No data found.  Updated Vital Signs BP 109/79 (BP Location: Left Arm)    Pulse 95    Temp 97.9 F (36.6 C) (Oral)    Resp 16    LMP 09/19/2015 Comment: negative preg test 10/02/15   SpO2 97%   Visual Acuity Right Eye Distance:   Left Eye Distance:   Bilateral Distance:    Right Eye Near:   Left Eye Near:    Bilateral Near:     Physical Exam Vitals signs and nursing note reviewed.  Constitutional:      General: She is not in acute distress.    Appearance: Normal appearance. She is not ill-appearing, toxic-appearing or diaphoretic.  HENT:     Head: Normocephalic and atraumatic.     Nose: Nose normal.    Eyes:     Conjunctiva/sclera: Conjunctivae normal.  Neck:     Musculoskeletal: Normal range of motion.  Pulmonary:     Effort: Pulmonary effort is normal.  Musculoskeletal:     Right knee: She exhibits decreased range of motion and swelling. She exhibits no LCL laxity, normal patellar mobility and no MCL laxity. Tenderness found. No patellar tendon tenderness noted.     Right ankle: She exhibits swelling. She exhibits normal range of motion, no ecchymosis, no deformity, no laceration and normal pulse.       Feet:     Comments: Able to ambulate in the room without difficulty.  Bilateral pedal pulse 2+  Skin:    General: Skin is warm and dry.  Neurological:     Mental Status: She is alert.  Psychiatric:     Comments: Tearful       UC Treatments / Results  Labs (all labs ordered are listed, but only abnormal results are displayed) Labs Reviewed - No data to display  EKG   Radiology Dg Knee Complete 4 Views Right  Result Date: 03/20/2019 CLINICAL DATA:  Swelling of right knee for 5 days EXAM: RIGHT KNEE - COMPLETE 4+ VIEW COMPARISON:  None. FINDINGS: No evidence of fracture, dislocation. There is suprapatellar effusion. Minimal osteophytosis is identified. Soft tissues are unremarkable. IMPRESSION: No acute fracture or dislocation. Suprapatellar effusion noted. Minimal osteophytosis is identified in the distal femur and proximal tibia. Electronically Signed   By: Sherian ReinWei-Chen  Lin M.D.   On: 03/20/2019 14:13   Dg Foot Complete Right  Result Date: 03/20/2019 CLINICAL DATA:  Dorsal right foot pain. No recent injury. EXAM: RIGHT FOOT COMPLETE - 3+ VIEW COMPARISON:  None. FINDINGS: There is a hallux valgus deformity with slight bunion formation. Prominent dorsal spurring at the second tarsal metatarsal joint. Lateral view suggests flatfoot deformity. No acute abnormalities. IMPRESSION: 1. No acute abnormalities. Hallux valgus with bunion formation. 2. Possible flatfoot deformity.  Electronically Signed   By: Francene BoyersJames  Maxwell M.D.   On: 03/20/2019 13:55    Procedures Procedures (including critical care time)  Medications Ordered in UC Medications -  No data to display  Initial Impression / Assessment and Plan / UC Course  I have reviewed the triage vital signs and the nursing notes.  Pertinent labs & imaging results that were available during my care of the patient were reviewed by me and considered in my medical decision making (see chart for details).    Suprapatellar effusion- most likely from arthritis Leg pain, foot pain most likely chronic from bone spurring and nerve pain.  X ray did not show any acute problems.  No concern for DVT Ibuprofen and flexeril is not helping much.  We will have her try prednisone and rest.  She can follow up with PCP as needed.     Final Clinical Impressions(s) / UC Diagnoses   Final diagnoses:  Arthralgia of right lower leg  Suprapatellar effusion of knee  Pain in joint involving right ankle and foot     Discharge Instructions     No acute problem on the x ray You do have some chronic stuff that could cause pain.  You have some bone spurring and flat foot deformity.  Also some bone spurring in the lower leg which could cause pain and nerve problems.  We can give you some prednisone today that may help with some of the pain and inflammation. You need to see an orthopedist.  Rest the leg, Ice the knee and elevate.      ED Prescriptions    Medication Sig Dispense Auth. Provider   predniSONE (STERAPRED UNI-PAK 21 TAB) 10 MG (21) TBPK tablet 6 tabs for 1 day, then 5 tabs for 1 das, then 4 tabs for 1 day, then 3 tabs for 1 day, 2 tabs for 1 day, then 1 tab for 1 day 21 tablet Monaye Blackie A, NP     PDMP not reviewed this encounter.   Dahlia Byes A, NP 03/20/19 1507

## 2019-06-04 ENCOUNTER — Other Ambulatory Visit: Payer: Self-pay

## 2019-06-04 DIAGNOSIS — Z20822 Contact with and (suspected) exposure to covid-19: Secondary | ICD-10-CM

## 2019-06-05 LAB — NOVEL CORONAVIRUS, NAA: SARS-CoV-2, NAA: NOT DETECTED

## 2019-06-13 ENCOUNTER — Encounter: Payer: Self-pay | Admitting: Critical Care Medicine

## 2019-06-14 NOTE — Progress Notes (Signed)
Patient ID: Ashley Hunt, female   DOB: 1960-10-28, 58 y.o.   MRN: [id number] This is a 58 year old female who is a resident of the [facility].  The patient gets her medical care at [facility] with [facility]  She is self-pay.  She requested a visit at our shelter clinic today to ask some questions around obtaining disability.  She does have a visit in the ER October 6 for arthralgia of both knees.  Other than that there are very few encounters this past year and in particular no frequent ER visits.  All of her documentation is in Ms. Placey's E HR and I do not have access to that  Her complaints were concerns over anemia and elevated blood sugars  I do not have an accurate medication list and there are no recent labs in the E HR other than a negative coronavirus test from 2 weeks ago from a shelter Covid screening we performed here at the [place]  I requested the patient follow-up with Ms. Synthia Innocent with regards to her concerns regarding obtaining disability and further medical care.  I explained to the patient that we have in agreement with the [facility] that we would not provide medical care for [facility] patients at this shelter clinic.  She understood that and said she would follow-up with Ms. Synthia Innocent on any for further medical needs

## 2019-06-20 ENCOUNTER — Encounter: Payer: Self-pay | Admitting: *Deleted

## 2019-06-20 ENCOUNTER — Telehealth: Payer: Self-pay | Admitting: *Deleted

## 2019-06-20 NOTE — Telephone Encounter (Signed)
Pt is requesting refill of 800 mg ibuprofen. Called NP and received no answer. Left message with Fleet Contras at front desk.

## 2019-06-20 NOTE — Congregational Nurse Program (Signed)
  Dept: [phone]   Congregational Nurse Program Note  Date of Encounter: 06/20/2019  Past Medical History: Past Medical History:  Diagnosis Date  . Asthma   . Diabetes mellitus without complication [facility])     Encounter Details: CNP Questionnaire - 06/20/19 1005      Questionnaire   Patient Status  Not Applicable    Race  Black or African American    Location Patient Served At  [employer]  Not Applicable    Uninsured  Uninsured (NEW 1x/quarter)    Food  No food insecurities    Housing/Utilities  Worried about losing housing    Transportation  No transportation needs   pt uses Energy [employer]  No, do not feel physically and emotionally safe where you currently live    Medication  Provided medication assistance    Medical Provider  Yes    Referrals  Area Agency;Medicaid   follow up with pcp   ED Visit Averted  Not Applicable    Life-Saving Intervention Made  Not Applicable      Pt came to [facility] requesting help with medication refill of 800 mg Ibuprofen.  Pt sees [name] NP at [facility] the [place]. Left message for NP that pt is requesting refill and asked pt to wait as NP was on her way to [facility]. Pt has no telephone and reports that she cannot wait. Pt receives her medication from the health department. Explained to pt that she may need to be seen before a prescription can be called into pharmacy. Pt left [facility] before seeing provider.

## 2020-05-12 ENCOUNTER — Emergency Department (HOSPITAL_COMMUNITY): Payer: Self-pay

## 2020-05-12 ENCOUNTER — Encounter (HOSPITAL_COMMUNITY): Payer: Self-pay

## 2020-05-12 ENCOUNTER — Emergency Department (HOSPITAL_COMMUNITY)
Admission: EM | Admit: 2020-05-12 | Discharge: 2020-05-12 | Disposition: A | Discharge status: Home / Self Care | Payer: Self-pay | Attending: Emergency Medicine | Admitting: Emergency Medicine

## 2020-05-12 ENCOUNTER — Other Ambulatory Visit: Payer: Self-pay

## 2020-05-12 DIAGNOSIS — M545 Low back pain, unspecified: Secondary | ICD-10-CM | POA: Insufficient documentation

## 2020-05-12 DIAGNOSIS — E119 Type 2 diabetes mellitus without complications: Secondary | ICD-10-CM | POA: Insufficient documentation

## 2020-05-12 DIAGNOSIS — J45909 Unspecified asthma, uncomplicated: Secondary | ICD-10-CM | POA: Insufficient documentation

## 2020-05-12 DIAGNOSIS — M25552 Pain in left hip: Secondary | ICD-10-CM | POA: Insufficient documentation

## 2020-05-12 MED ORDER — PREDNISONE 10 MG (21) PO TBPK: ORAL_TABLET | ORAL | 0 refills | Status: DC

## 2020-05-12 MED ORDER — METHOCARBAMOL 500 MG PO TABS: 500.0000 mg | ORAL_TABLET | Freq: Two times a day (BID) | ORAL | 0 refills | Status: DC

## 2020-05-12 NOTE — Discharge Instructions (Addendum)
You have been seen today for hip pain. There were no acute abnormalities on the x-rays, including no sign of fracture or dislocation, however, there could be injuries to the soft tissues, such as the ligaments or tendons that are not seen on xrays. There could also be what are called occult fractures that are small fractures not seen on xray. Antiinflammatory medications: Take 600 mg of ibuprofen every 6 hours or 440 mg (over the counter dose) to 500 mg (prescription dose) of naproxen every 12 hours for the next 3 days. After this time, these medications may be used as needed for pain. Take these medications with food to avoid upset stomach. Choose only one of these medications, do not take them together. Acetaminophen (generic for Tylenol): Should you continue to have additional pain while taking the ibuprofen or naproxen, you may add in acetaminophen as needed. Your daily total maximum amount of acetaminophen from all sources should be limited to 4000mg /day for persons without liver problems, or 2000mg /day for those with liver problems. Methocarbamol: Methocarbamol (generic for Robaxin) is a muscle relaxer and can help relieve stiff muscles or muscle spasms.  Do not drive or perform other dangerous activities while taking this medication as it can cause drowsiness as well as changes in reaction time and judgement. Do not take the previously prescribed muscle relaxer, cyclobenzaprine (Flexeril), while taking the methocarbamol. Prednisone: Take the prednisone, as prescribed, until finished. If you are a diabetic, please know prednisone can raise your blood sugar temporarily. Ice: May apply ice to the area over the next 24 hours for 15 minutes at a time to reduce swelling. Elevation: Keep the extremity elevated as often as possible to reduce pain and inflammation. Exercises: Start by performing these exercises a few times a week, increasing the frequency until you are performing them twice daily.  Follow up:  Follow-up with the orthopedic specialist, ideally within the next couple weeks. Call to make an appointment. Return: Return to the ED for numbness, weakness, increasing pain, overall worsening symptoms, loss of function, or if symptoms are not improving, you have tried to follow up with the orthopedic specialist, and have been unable to do so.  For prescription assistance, may try using prescription discount sites or apps, such as goodrx.com or Good Rx smart phone app.

## 2020-05-12 NOTE — ED Triage Notes (Addendum)
Pt reports left sided hip pain for the past 7 months. Hx of hip fracture in 2010. No new trauma or injury reported. Pt ambulatory.

## 2020-05-12 NOTE — ED Provider Notes (Signed)
[facility] EMERGENCY DEPARTMENT Provider Note   CSN: [id number] Arrival date & time: 05/12/20  7829     History Chief Complaint  Patient presents with  . Hip Pain    Rin Gorton is a 59 y.o. female.  HPI      Ellsie Violette is a 59 y.o. female, with a history of asthma, DM, lumbar radiculopathy, presenting to the ED with left hip pain for at least the last 6 to 7 months.  Patient states she has a history of left hip pain and lower back pain since a fall with pelvic/hip fracture in [place] several years ago. She denies any recent trauma, but states she thinks sometimes the hip "pulls out of place."  Denies numbness, fever, weakness, abdominal pain, swelling, deformity, changes in bowel or bladder function, saddle anesthesias, or any other complaints.   Past Medical History:  Diagnosis Date  . Asthma   . Diabetes mellitus without complication [facility])     Patient Active Problem List   Diagnosis Date Noted  . Lumbar radiculopathy 04/18/2017  . Spinal stenosis of lumbar region with neurogenic claudication 04/18/2017  . Unspecified mood (affective) disorder (HCC) 07/10/2015  . Anxiety     History reviewed. No pertinent surgical history.   OB History    Gravida  0   Para  0   Term  0   Preterm  0   AB  0   Living  0     SAB  0   TAB  0   Ectopic  0   Multiple  0   Live Births              Family History  Family history unknown: Yes    Social History   Tobacco Use  . Smoking status: Never Smoker  . Smokeless tobacco: Never Used  Substance Use Topics  . Alcohol use: No  . Drug use: No    Home Medications Prior to Admission medications   Medication Sig Start Date End Date Taking? Authorizing Provider  ARIPiprazole (ABILIFY) 10 MG tablet Take 10 mg by mouth daily.    [provider]  ibuprofen (ADVIL,MOTRIN) 800 MG tablet Take 1 tablet (800 mg total) by mouth every 6 (six) hours as needed for moderate  pain. 10/02/15   Law, Waylan Boga, PA-C  methocarbamol (ROBAXIN) 500 MG tablet Take 1 tablet (500 mg total) by mouth 2 (two) times daily. 05/12/20   Maxten Shuler C, PA-C  predniSONE (STERAPRED UNI-PAK 21 TAB) 10 MG (21) TBPK tablet Take 6 tabs (60mg ) day 1, 5 tabs (50mg ) day 2, 4 tabs (40mg ) day 3, 3 tabs (30mg ) day 4, 2 tabs (20mg ) day 5, and 1 tab (10mg ) day 6. 05/12/20   Kalyn Hofstra C, PA-C  furosemide (LASIX) 20 MG tablet Take 1 tablet (20 mg total) by mouth daily for 3 days. 09/19/17 03/20/19  , NP    Allergies    Shellfish allergy  Review of Systems   Review of Systems  Musculoskeletal: Positive for arthralgias.  Neurological: Negative for weakness and numbness.    Physical Exam Updated Vital Signs BP 112/89 (BP Location: Right Arm)   Pulse 93   Temp 98.3 F (36.8 C) (Oral)   Resp 16   Ht 5\' 6"  (1.676 m)   Wt 112.5 kg   LMP 09/19/2015 Comment: negative preg test 10/02/15  SpO2 99%   BMI 40.03 kg/m   Physical Exam Vitals and nursing note reviewed.  Constitutional:  General: She is not in acute distress.    Appearance: She is well-developed. She is obese. She is not diaphoretic.  HENT:     Head: Normocephalic and atraumatic.  Eyes:     Conjunctiva/sclera: Conjunctivae normal.  Cardiovascular:     Rate and Rhythm: Normal rate and regular rhythm.     Pulses:          Dorsalis pedis pulses are 2+ on the left side.       Posterior tibial pulses are 2+ on the left side.  Pulmonary:     Effort: Pulmonary effort is normal.  Abdominal:     Palpations: Abdomen is soft.     Tenderness: There is no abdominal tenderness.  Musculoskeletal:     Cervical back: Neck supple.     Comments: No noted deformity, point tenderness, instability, swelling to the left hip, lower back, or the rest of the left lower extremity. Full passive range of motion in the left hip without noted pain or difficulty.  Skin:    General: Skin is warm and dry.     Capillary Refill:  Capillary refill takes less than 2 seconds.     Coloration: Skin is not pale.  Neurological:     Mental Status: She is alert.     Comments: Sensation grossly intact to light touch in the lower extremities bilaterally. No saddle anesthesias. Strength 5/5 in the bilateral lower extremities. No noted gait deficit. Coordination intact.  Psychiatric:        Behavior: Behavior normal.     ED Results / Procedures / Treatments   Labs (all labs ordered are listed, but only abnormal results are displayed) Labs Reviewed - No data to display  EKG None  Radiology DG Hip Unilat With Pelvis 2-3 Views Left  Result Date: 05/12/2020 CLINICAL DATA:  Left hip pain. EXAM: DG HIP (WITH OR WITHOUT PELVIS) 2-3V LEFT COMPARISON:  10/02/2015 FINDINGS: Pelvic bony ring is intact. No gross abnormality to the right hip. Left hip is located without a fracture or significant joint space narrowing. IMPRESSION: No acute abnormality to the pelvis or left hip. No significant degenerative changes in the hips. Electronically Signed   By: Richarda Overlie M.D.   On: 05/12/2020 09:02    Procedures Procedures (including critical care time)  Medications Ordered in ED Medications - No data to display  ED Course  I have reviewed the triage vital signs and the nursing notes.  Pertinent labs & imaging results that were available during my care of the patient were reviewed by me and considered in my medical decision making (see chart for details).    MDM Rules/Calculators/A&P                          Patient presents with chronic left hip pain.  No evidence of neurovascular compromise.   I personally reviewed and interpreted the patient's imaging. No acute abnormalities on hip x-ray. Recommend orthopedic follow-up.  She was noted to have seen Dr. Roda Shutters from [facility] several years ago. She was encouraged to follow-up with them as soon as possible. The patient was given instructions for home care as well as return  precautions. Patient voices understanding of these instructions, accepts the plan, and is comfortable with discharge.     Final Clinical Impression(s) / ED Diagnoses Final diagnoses:  Left hip pain    Rx / DC Orders ED Discharge Orders         Ordered  methocarbamol (ROBAXIN) 500 MG tablet  2 times daily        05/12/20 0955    predniSONE (STERAPRED UNI-PAK 21 TAB) 10 MG (21) TBPK tablet        05/12/20 0955           Anselm Pancoast, PA-C 05/12/20 1042    Melene Plan, DO 05/12/20 1224

## 2020-06-26 ENCOUNTER — Encounter (HOSPITAL_COMMUNITY): Payer: Self-pay | Admitting: Emergency Medicine

## 2020-06-26 ENCOUNTER — Emergency Department (HOSPITAL_COMMUNITY)
Admission: EM | Admit: 2020-06-26 | Discharge: 2020-06-26 | Disposition: A | Discharge status: Home / Self Care | Payer: Self-pay | Attending: Emergency Medicine | Admitting: Emergency Medicine

## 2020-06-26 DIAGNOSIS — E119 Type 2 diabetes mellitus without complications: Secondary | ICD-10-CM | POA: Insufficient documentation

## 2020-06-26 DIAGNOSIS — G8929 Other chronic pain: Secondary | ICD-10-CM | POA: Insufficient documentation

## 2020-06-26 DIAGNOSIS — J45909 Unspecified asthma, uncomplicated: Secondary | ICD-10-CM | POA: Insufficient documentation

## 2020-06-26 DIAGNOSIS — M25552 Pain in left hip: Secondary | ICD-10-CM | POA: Insufficient documentation

## 2020-06-26 MED ORDER — IBUPROFEN 800 MG PO TABS: 800.0000 mg | ORAL_TABLET | Freq: Four times a day (QID) | ORAL | 0 refills | Status: AC | PRN

## 2020-06-26 MED ORDER — METHOCARBAMOL 500 MG PO TABS: 500.0000 mg | ORAL_TABLET | Freq: Two times a day (BID) | ORAL | 0 refills | Status: AC

## 2020-06-26 NOTE — ED Provider Notes (Signed)
[facility] EMERGENCY DEPARTMENT Provider Note   CSN: [id number] Arrival date & time: 06/26/20  7829     History Chief Complaint  Patient presents with  . Leg Pain    Ashley Hunt is a 60 y.o. female.  The history is provided by the patient and medical records. No language interpreter was used.  Leg Pain Associated symptoms: back pain   Associated symptoms: no fever      60 year old female significant history of lumbar radiculopathy secondary to spinal stenosis, anxiety, mood disorder, diabetes presenting complaining of leg pain.  Patient states she has had recurrent pain to her left hip ongoing for at least 2 years worse in the past 3 months.  She feels as if her hip is joint and sometimes walking causing increasing pain radiates down her leg and thigh.  She reported having trouble reaching down towards ankle to tie her shoes that time due to her discomfort.  She mentioned being seen by her orthopedist for this in the past and has had MRI of her back without any definitive finding.  She was seen in the ED 7 months prior for the same complaint and was told that her x-ray was negative.  She reports she is "trying to build a case" and would like to have as much information as possible.  She denies any bowel bladder incontinence or saddle anesthesia no active cancer or history of IV drug use.  She denies any fever chills.  She denies any significant numbness.  She did try some over-the-counter medication without relief  Past Medical History:  Diagnosis Date  . Asthma   . Diabetes mellitus without complication [facility])     Patient Active Problem List   Diagnosis Date Noted  . Lumbar radiculopathy 04/18/2017  . Spinal stenosis of lumbar region with neurogenic claudication 04/18/2017  . Unspecified mood (affective) disorder (HCC) 07/10/2015  . Anxiety     History reviewed. No pertinent surgical history.   OB History    Gravida  0   Para  0   Term  0    Preterm  0   AB  0   Living  0     SAB  0   IAB  0   Ectopic  0   Multiple  0   Live Births              Family History  Family history unknown: Yes    Social History   Tobacco Use  . Smoking status: Never Smoker  . Smokeless tobacco: Never Used  Substance Use Topics  . Alcohol use: No  . Drug use: No    Home Medications Prior to Admission medications   Medication Sig Start Date End Date Taking? Authorizing Provider  ARIPiprazole (ABILIFY) 10 MG tablet Take 10 mg by mouth daily.    [provider]  ibuprofen (ADVIL,MOTRIN) 800 MG tablet Take 1 tablet (800 mg total) by mouth every 6 (six) hours as needed for moderate pain. 10/02/15   Law, Waylan Boga, PA-C  methocarbamol (ROBAXIN) 500 MG tablet Take 1 tablet (500 mg total) by mouth 2 (two) times daily. 05/12/20   Joy, Shawn C, PA-C  predniSONE (STERAPRED UNI-PAK 21 TAB) 10 MG (21) TBPK tablet Take 6 tabs (60mg ) day 1, 5 tabs (50mg ) day 2, 4 tabs (40mg ) day 3, 3 tabs (30mg ) day 4, 2 tabs (20mg ) day 5, and 1 tab (10mg ) day 6. 05/12/20   Joy, Shawn C, PA-C  furosemide (LASIX) 20  MG tablet Take 1 tablet (20 mg total) by mouth daily for 3 days. 09/19/17 03/20/19  Georgetta Haber, NP    Allergies    Shellfish allergy  Review of Systems   Review of Systems  Constitutional: Negative for fever.  Musculoskeletal: Positive for arthralgias and back pain.  Skin: Negative for rash and wound.  Neurological: Negative for numbness.    Physical Exam Updated Vital Signs BP 116/80 (BP Location: Right Arm)   Pulse 82   Temp 98.4 F (36.9 C)   Resp 16   LMP 09/19/2015 Comment: negative preg test 10/02/15  SpO2 100%   Physical Exam Vitals and nursing note reviewed.  Constitutional:      General: She is not in acute distress.    Appearance: She is well-developed and well-nourished. She is obese.  HENT:     Head: Atraumatic.  Eyes:     Conjunctiva/sclera: Conjunctivae normal.  Musculoskeletal:        General:  Tenderness (Left lower extremity, mild tenderness to palpation of the left lateral hip and knee however normal passive range of motion throughout the knee and hip without any deformity or shortening of the leg.  Intact distal pedal pulses.  Compartment soft) present.     Cervical back: Neck supple.     Comments: No significant midline spine tenderness crepitus or step-off.  Negative straight leg raise bilaterally.  Sensation is intact to distal extremities.  Skin:    Findings: No rash.  Neurological:     Mental Status: She is alert.  Psychiatric:        Mood and Affect: Mood and affect normal.     ED Results / Procedures / Treatments   Labs (all labs ordered are listed, but only abnormal results are displayed) Labs Reviewed - No data to display  EKG None  Radiology No results found.  Procedures Procedures (including critical care time)  Medications Ordered in ED Medications - No data to display  ED Course  I have reviewed the triage vital signs and the nursing notes.  Pertinent labs & imaging results that were available during my care of the patient were reviewed by me and considered in my medical decision making (see chart for details).    MDM Rules/Calculators/A&P                          BP 116/80 (BP Location: Right Arm)   Pulse 82   Temp 98.4 F (36.9 C)   Resp 16   LMP 09/19/2015 Comment: negative preg test 10/02/15  SpO2 100%   Final Clinical Impression(s) / ED Diagnoses Final diagnoses:  Chronic left hip pain    Rx / DC Orders ED Discharge Orders    None     11:00 AM Patient here with recurrent left hip pain and radicular pain.  This is not new.  Has been seen and evaluated for this in the past without any definite finding.  No red flags.  No recent injury and therefore no strong indication to repeat imaging at this time.  Reassurance given, recommend outpatient follow-up with orthopedics for further care.  Per her request, I will print out her previous  x-ray of her left hip both the image and the impression.   Fayrene Helper, PA-C 06/26/20 1104    Milagros Loll, MD 06/27/20 [phone]

## 2020-06-26 NOTE — ED Triage Notes (Signed)
Patient complains of left thigh and hip pain stating "it feels like its going to snap at any time". States the pain in her hip "feels like a hernia". Patient states this pain has been ongoing since she fell in 2010 but pain has been getting worse recently.

## 2020-06-26 NOTE — Discharge Instructions (Signed)
Please call and follow-up closely with orthopedic specialist for further management of your recurrent hip pain.

## 2020-07-04 ENCOUNTER — Ambulatory Visit: Payer: Self-pay | Admitting: Orthopaedic Surgery

## 2020-07-09 ENCOUNTER — Encounter: Payer: Self-pay | Admitting: *Deleted

## 2020-07-09 NOTE — Congregational Nurse Program (Signed)
  Dept: [phone]   Congregational Nurse Program Note  Date of Encounter: 07/09/2020  Past Medical History: Past Medical History:  Diagnosis Date  . Asthma   . Diabetes mellitus without complication [facility])     Encounter Details:  CNP Questionnaire - 07/09/20 1107      Questionnaire   Do you give verbal consent to treat you today? Yes    Visit Setting Church or Organization    Location Patient Served At [facility]    Patient Status Homeless   staying in [place] provided by [facility]   Medical Provider Yes    Insurance Uninsured (Includes [employer] [place])    Intervention Counsel;Support;Refer   orange card application   Housing/Utilities No permanent housing    Referrals Orange Card[employer]          Followed up with client due to recent ED visit. Client reports she has chronic hip, low back and leg discomfort. She is a client at [facility] and [name] at [facility]. Client would like additional help due to her chronic pain issue. Discussed [employer]. She reports she has sent in multiple requests and has not gotten a card. Followed up and could not find were documents had ever been sent. Gave application to client and necessary documents were circled on the application. Explained if she would complete application and bring necessary documents to [facility], Clinical research associate would fax to [facility] for client. She acknowledges understanding.  Vallerie Hentz W RN CN [phone]

## 2020-08-08 ENCOUNTER — Encounter: Payer: Self-pay | Admitting: *Deleted

## 2020-08-08 NOTE — Congregational Nurse Program (Signed)
  Dept: [phone]   Congregational Nurse Program Note  Date of Encounter: 08/08/2020  Past Medical History: Past Medical History:  Diagnosis Date  . Asthma   . Diabetes mellitus without complication [facility])     Encounter Details:  CNP Questionnaire - 08/08/20 1311      Questionnaire   Do you give verbal consent to treat you today? Yes    Visit Setting Church or Organization    Location Patient Served At [facility]    Patient Status Homeless    Medical Provider Yes    Insurance Uninsured (Includes [employer] Card[employer] [place])    Intervention Support    Housing/Utilities No permanent housing          Client came in asking about her orange card. Explained she needed to bring in information that was circled on her last visit and papers. Then papers could be faxed to [facility] for her. Yvonna Brun W RN CN

## 2020-09-17 ENCOUNTER — Encounter: Payer: Self-pay | Admitting: Critical Care Medicine

## 2020-09-17 NOTE — Progress Notes (Signed)
Patient ID: Ashley Hunt, female   DOB: September 25, 1960, 60 y.o.   MRN: [id number] This a 61 year old female seen at the [facility] actually seen her previously but that was back in early of 2020.  She is a primary care patient of Dr. Phillips Odor who is at the [facility]  This patient complains of chronic low back pain has been to the ER couple times this year for this.  The last set of x-rays were that of just of the hip in November 2021.  This showed to be normal however she had an MRI of the lumbar spine in 2018 that was abnormal.  She is wanting my opinion as to her care and I informed her that we cannot duplicate care with the [facility] and she needs to stay with Ms. Hilbert Corrigan  What I did do is give this patient over-the-counter Tylenol and ibuprofen and instruct her as to the proper use I will also see if I can get her a Rollator  I will communicate with Ms. Hilbert Corrigan the importance that she obtain an orange card and reapply for this and hopefully get a referral over to orthopedic spine

## 2020-10-15 ENCOUNTER — Other Ambulatory Visit: Payer: Self-pay

## 2020-10-15 ENCOUNTER — Emergency Department (HOSPITAL_COMMUNITY): Payer: Self-pay

## 2020-10-15 ENCOUNTER — Emergency Department (HOSPITAL_COMMUNITY)
Admission: EM | Admit: 2020-10-15 | Discharge: 2020-10-15 | Disposition: A | Discharge status: Home / Self Care | Payer: Self-pay | Attending: Emergency Medicine | Admitting: Emergency Medicine

## 2020-10-15 DIAGNOSIS — E119 Type 2 diabetes mellitus without complications: Secondary | ICD-10-CM | POA: Insufficient documentation

## 2020-10-15 DIAGNOSIS — M25552 Pain in left hip: Secondary | ICD-10-CM | POA: Insufficient documentation

## 2020-10-15 DIAGNOSIS — J45909 Unspecified asthma, uncomplicated: Secondary | ICD-10-CM | POA: Insufficient documentation

## 2020-10-15 MED ORDER — DICLOFENAC SODIUM 1 % EX GEL: 2.0000 g | Freq: Four times a day (QID) | CUTANEOUS | 0 refills | Status: AC

## 2020-10-15 NOTE — ED Provider Notes (Signed)
[facility] EMERGENCY DEPARTMENT Provider Note   CSN: [id number] Arrival date & time: 10/15/20  8299     History Chief Complaint  Patient presents with  . Hip Pain    Ashley Hunt is a 60 y.o. female with past medical history of lumbar radiculopathy secondary to spinal stenosis, DM, and recurrent left-sided hip pain who presents the ED with complaints of left-sided hip pain.  I reviewed her medical record and she was seen twice in the past 6 months for same complaints.  During last ED encounter she mentioned that she was "trying to build a case".  Plain films of hip obtained that day demonstrated no acute abnormalities of the pelvis or left hip.  No significant degenerative changes noted.  Outpatient orthopedic follow-up was recommended.    MRI lumbar spine obtained 05/13/2017 showed mild noncompressive degenerative changes spanning T10-L4 and she had bilateral facet arthropathy, worse on the left side, which may be associated with her left lower back/hip pain.  On my examination, she continues to endorse left-sided hip pain.  She states that she broke it in [place] in 2012 and then she had a fall with Dr. Claiborne Billings a bunion on her left foot.  She states that her symptoms have also been due to her being flat-footed.  She states that whenever she ambulates she feels as though her left hip and knee "pop" which is why she has not been able to work for 7 years.  She again references that she is currently building a case for disability.  She is followed by Dr. Roda Shutters, but has not seen him in 3 months.  She states that she is chronically on NSAIDs, Tylenol, and Flexeril for her pain symptoms.  She denies any new complaints, fevers or chills, IVDA, new traumas, numbness or weakness, inability to ambulate, or any other symptoms.  HPI     Past Medical History:  Diagnosis Date  . Asthma   . Diabetes mellitus without complication [facility])     Patient Active Problem List   Diagnosis  Date Noted  . Lumbar radiculopathy 04/18/2017  . Spinal stenosis of lumbar region with neurogenic claudication 04/18/2017  . Unspecified mood (affective) disorder (HCC) 07/10/2015  . Anxiety     No past surgical history on file.   OB History    Gravida  0   Para  0   Term  0   Preterm  0   AB  0   Living  0     SAB  0   IAB  0   Ectopic  0   Multiple  0   Live Births              Family History  Family history unknown: Yes    Social History   Tobacco Use  . Smoking status: Never Smoker  . Smokeless tobacco: Never Used  Substance Use Topics  . Alcohol use: No  . Drug use: No    Home Medications Prior to Admission medications   Medication Sig Start Date End Date Taking? Authorizing Provider  diclofenac Sodium (VOLTAREN) 1 % GEL Apply 2 g topically 4 (four) times daily. 10/15/20  Yes Lorelee New, PA-C  ARIPiprazole (ABILIFY) 10 MG tablet Take 10 mg by mouth daily.    [provider]  ibuprofen (ADVIL) 800 MG tablet Take 1 tablet (800 mg total) by mouth every 6 (six) hours as needed for moderate pain. 06/26/20   Fayrene Helper, PA-C  methocarbamol (ROBAXIN)  500 MG tablet Take 1 tablet (500 mg total) by mouth 2 (two) times daily. 06/26/20   Fayrene Helper, PA-C  predniSONE (STERAPRED UNI-PAK 21 TAB) 10 MG (21) TBPK tablet Take 6 tabs (60mg ) day 1, 5 tabs (50mg ) day 2, 4 tabs (40mg ) day 3, 3 tabs (30mg ) day 4, 2 tabs (20mg ) day 5, and 1 tab (10mg ) day 6. 05/12/20   Joy, Shawn C, PA-C  furosemide (LASIX) 20 MG tablet Take 1 tablet (20 mg total) by mouth daily for 3 days. 09/19/17 03/20/19  , NP    Allergies    Shellfish allergy  Review of Systems   Review of Systems  All other systems reviewed and are negative.   Physical Exam Updated Vital Signs BP 110/75   Pulse 83   Temp 98.8 F (37.1 C) (Oral)   Resp 16   Ht 5\' 6"  (1.676 m)   Wt 120.2 kg   LMP 09/19/2015 Comment: negative preg test 10/02/15  SpO2 98%   BMI 42.77 kg/m    Physical Exam Vitals and nursing note reviewed. Exam conducted with a chaperone present.  Constitutional:      General: She is not in acute distress.    Appearance: She is not toxic-appearing.  HENT:     Head: Normocephalic and atraumatic.  Eyes:     General: No scleral icterus.    Conjunctiva/sclera: Conjunctivae normal.  Cardiovascular:     Rate and Rhythm: Normal rate.     Pulses: Normal pulses.  Pulmonary:     Effort: Pulmonary effort is normal. No respiratory distress.  Musculoskeletal:        General: Tenderness present. No swelling, deformity or signs of injury. Normal range of motion.  Skin:    General: Skin is dry.  Neurological:     General: No focal deficit present.     Mental Status: She is alert and oriented to person, place, and time.     GCS: GCS eye subscore is 4. GCS verbal subscore is 5. GCS motor subscore is 6.     Cranial Nerves: No cranial nerve deficit.     Sensory: No sensory deficit.     Motor: No weakness.     Coordination: Coordination normal.     Gait: Gait normal.  Psychiatric:        Mood and Affect: Mood normal.        Behavior: Behavior normal.        Thought Content: Thought content normal.     ED Results / Procedures / Treatments   Labs (all labs ordered are listed, but only abnormal results are displayed) Labs Reviewed - No data to display  EKG None  Radiology DG Hip Unilat W or Wo Pelvis 2-3 Views Left  Result Date: 10/15/2020 CLINICAL DATA:  left hip pain, acute on chronic EXAM: DG HIP (WITH OR WITHOUT PELVIS) 2-3V LEFT COMPARISON:  Radiograph 05/12/2020 FINDINGS: There is no evidence of fracture. There is mild left hip degenerative change. IMPRESSION: Mild left hip degenerative change.  No evidence of fracture. Electronically Signed   By: 05/20/19   On: 10/15/2020 10:59    Procedures Procedures   Medications Ordered in ED Medications - No data to display  ED Course  I have reviewed the triage vital signs and the  nursing notes.  Pertinent labs & imaging results that were available during my care of the patient were reviewed by me and considered in my medical decision making (see chart for details).  MDM Rules/Calculators/A&P                          Ashley Hunt was evaluated in Emergency Department on 10/15/2020 for the symptoms described in the history of present illness. She was evaluated in the context of the global COVID-19 pandemic, which necessitated consideration that the patient might be at risk for infection with the SARS-CoV-2 virus that causes COVID-19. Institutional protocols and algorithms that pertain to the evaluation of patients at risk for COVID-19 are in a state of rapid change based on information released by regulatory bodies including the CDC and federal and state organizations. These policies and algorithms were followed during the patient's care in the ED.  I personally reviewed patient's medical chart and all notes from triage and staff during today's encounter. I have also ordered and reviewed all labs and imaging that I felt to be medically necessary in the evaluation of this patient's complaints and with consideration of their physical exam. If needed, translation services were available and utilized.   Patient in the ED for acute on chronic left hip pain.  She has had unremarkable lumbar MRI in the past, possible left-sided facet arthropathy which may be contributing to her symptoms.  However, she does not describe radicular symptoms on my exam.  Instead she is describing a mechanical "popping" with ambulation.  She feels as though she has stress fractures secondary to her trauma sustained 10 years ago.  Plain films are personally reviewed and demonstrate no acute osseous abnormalities.  Mild left hip degenerative disease.  Cannot exclude ligamentous or tendinous injuries.  Low suspicion for septic joint or other emergent pathology.  She is neurovascularly and functionally  intact.  Ambulatory.  Denies incontinence or other concerning focal deficits.  Encouraging outpatient follow-up with her orthopedist for ongoing evaluation and management.  Recommending that she continue with her current pain regimen.  We will add topical Voltaren gel as well as ambulatory referral to physical therapy.  ED return precautions discussed.  Final Clinical Impression(s) / ED Diagnoses Final diagnoses:  Left hip pain    Rx / DC Orders ED Discharge Orders         Ordered    Ambulatory referral to Physical Therapy        10/15/20 1122    diclofenac Sodium (VOLTAREN) 1 % GEL  4 times daily        10/15/20 1122           Elvera Maria 10/15/20 1122    Koleen Distance, MD 10/15/20 1349

## 2020-10-15 NOTE — Discharge Instructions (Addendum)
Please follow-up with your primary care provider regarding today's ED encounter.  You will need to see orthopedist, Dr. Roda Shutters, for ongoing evaluation and management of your acute on chronic left-sided hip pain.  Please continue with your current pain regimen.  I have also added Voltaren gel which can apply to the affected areas to help mitigate pain symptoms.  I recommend regular stretching exercises.  Physical therapy will call you to schedule an appointment for evaluation given your acute on chronic symptoms.  Return to the ER or seek immediate medical attention should you experience any significant joint swelling, redness or other overlying skin changes, fevers or chills, numbness, or any other new or worsening symptoms.
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
## 2020-10-15 NOTE — ED Notes (Signed)
Patient transported to X-ray 

## 2020-12-15 ENCOUNTER — Emergency Department (HOSPITAL_COMMUNITY)
Admission: EM | Admit: 2020-12-15 | Discharge: 2020-12-15 | Disposition: A | Discharge status: Home / Self Care | Payer: Self-pay | Attending: Emergency Medicine | Admitting: Emergency Medicine

## 2020-12-15 ENCOUNTER — Emergency Department (HOSPITAL_COMMUNITY): Payer: Self-pay

## 2020-12-15 ENCOUNTER — Encounter (HOSPITAL_COMMUNITY): Payer: Self-pay | Admitting: Emergency Medicine

## 2020-12-15 ENCOUNTER — Other Ambulatory Visit: Payer: Self-pay

## 2020-12-15 DIAGNOSIS — R059 Cough, unspecified: Secondary | ICD-10-CM

## 2020-12-15 DIAGNOSIS — E119 Type 2 diabetes mellitus without complications: Secondary | ICD-10-CM | POA: Insufficient documentation

## 2020-12-15 DIAGNOSIS — U071 COVID-19: Secondary | ICD-10-CM | POA: Insufficient documentation

## 2020-12-15 DIAGNOSIS — J45909 Unspecified asthma, uncomplicated: Secondary | ICD-10-CM | POA: Insufficient documentation

## 2020-12-15 HISTORY — DX: Unspecified osteoarthritis, unspecified site: M19.90

## 2020-12-15 LAB — RESP PANEL BY RT-PCR (FLU A&B, COVID) ARPGX2
Influenza A by PCR: NEGATIVE
Influenza B by PCR: NEGATIVE
SARS Coronavirus 2 by RT PCR: POSITIVE — AB

## 2020-12-15 MED ORDER — NIRMATRELVIR/RITONAVIR (PAXLOVID)TABLET
3.0000 | ORAL_TABLET | Freq: Two times a day (BID) | ORAL | 0 refills | Status: AC
  Filled 2020-12-15: qty 30, 5d supply, fill #0

## 2020-12-15 MED ORDER — ACETAMINOPHEN 325 MG PO TABS
650.0000 mg | ORAL_TABLET | Freq: Once | ORAL | Status: AC | PRN
  Administered 2020-12-15: 650 mg via ORAL

## 2020-12-15 NOTE — ED Triage Notes (Signed)
Patient reports fever, chills, cough and nasal drip x 3 days. Denies any known contacts. Requesting covid tests. Took 2 ibuprofen around lunch time today.

## 2020-12-15 NOTE — ED Provider Notes (Signed)
[facility] EMERGENCY DEPARTMENT Provider Note   CSN: [id number] Arrival date & time: 12/15/20  1617     History Chief Complaint  Patient presents with   Fever    Ashley Hunt is a 60 y.o. female.  HPI She presents for evaluation of illness for 3 days associated with coughing, fever, myalgia, and abdominal swelling.  She denies vomiting or diarrhea.  She states she has had a COVID-vaccine but not a booster.  She lives at a shelter currently.  She has not had any other ongoing complaints.  There are no other known modifying factors.    Past Medical History:  Diagnosis Date   Arthritis    Asthma    Diabetes mellitus without complication [facility])     Patient Active Problem List   Diagnosis Date Noted   Lumbar radiculopathy 04/18/2017   Spinal stenosis of lumbar region with neurogenic claudication 04/18/2017   Unspecified mood (affective) disorder (HCC) 07/10/2015   Anxiety     History reviewed. No pertinent surgical history.   OB History     Gravida  0   Para  0   Term  0   Preterm  0   AB  0   Living  0      SAB  0   IAB  0   Ectopic  0   Multiple  0   Live Births              Family History  Family history unknown: Yes    Social History   Tobacco Use   Smoking status: Never   Smokeless tobacco: Never  Substance Use Topics   Alcohol use: No   Drug use: No    Home Medications Prior to Admission medications   Medication Sig Start Date End Date Taking? Authorizing Provider  nirmatrelvir/ritonavir EUA (PAXLOVID) TABS Take 3 tablets by mouth 2 (two) times daily for 5 days. Patient GFR is >60. Take nirmatrelvir (150 mg) two tablets twice daily for 5 days and ritonavir (100 mg) one tablet twice daily for 5 days. 12/15/20 12/20/20 Yes Mancel Bale, MD  ARIPiprazole (ABILIFY) 10 MG tablet Take 10 mg by mouth daily.    [provider]  diclofenac Sodium (VOLTAREN) 1 % GEL Apply 2 g topically 4 (four) times daily.  10/15/20   Lorelee New, PA-C  ibuprofen (ADVIL) 800 MG tablet Take 1 tablet (800 mg total) by mouth every 6 (six) hours as needed for moderate pain. 06/26/20   Fayrene Helper, PA-C  methocarbamol (ROBAXIN) 500 MG tablet Take 1 tablet (500 mg total) by mouth 2 (two) times daily. 06/26/20   Fayrene Helper, PA-C  predniSONE (STERAPRED UNI-PAK 21 TAB) 10 MG (21) TBPK tablet Take 6 tabs (60mg ) day 1, 5 tabs (50mg ) day 2, 4 tabs (40mg ) day 3, 3 tabs (30mg ) day 4, 2 tabs (20mg ) day 5, and 1 tab (10mg ) day 6. 05/12/20   Joy, Shawn C, PA-C  furosemide (LASIX) 20 MG tablet Take 1 tablet (20 mg total) by mouth daily for 3 days. 09/19/17 03/20/19  , NP    Allergies    Shellfish allergy  Review of Systems   Review of Systems  All other systems reviewed and are negative.  Physical Exam Updated Vital Signs BP 111/70 (BP Location: Left Arm)   Pulse 89   Temp 98.8 F (37.1 C)   Resp 18   LMP 09/19/2015 Comment: negative preg test 10/02/15  SpO2 97%   Physical  Exam Vitals and nursing note reviewed.  Constitutional:      General: She is not in acute distress.    Appearance: She is well-developed. She is obese. She is not ill-appearing, toxic-appearing or diaphoretic.  HENT:     Head: Normocephalic and atraumatic.     Right Ear: External ear normal.     Left Ear: External ear normal.  Eyes:     Conjunctiva/sclera: Conjunctivae normal.     Pupils: Pupils are equal, round, and reactive to light.  Neck:     Trachea: Phonation normal.  Cardiovascular:     Rate and Rhythm: Normal rate.     Heart sounds: Normal heart sounds.  Pulmonary:     Effort: Pulmonary effort is normal.  Abdominal:     General: There is no distension.  Musculoskeletal:        General: Normal range of motion.     Cervical back: Normal range of motion and neck supple.  Skin:    General: Skin is warm and dry.  Neurological:     Mental Status: She is alert and oriented to person, place, and time.     Cranial  Nerves: No cranial nerve deficit.     Sensory: No sensory deficit.     Motor: No abnormal muscle tone.     Coordination: Coordination normal.  Psychiatric:        Mood and Affect: Mood normal.        Behavior: Behavior normal.    ED Results / Procedures / Treatments   Labs (all labs ordered are listed, but only abnormal results are displayed) Labs Reviewed  RESP PANEL BY RT-PCR (FLU A&B, COVID) ARPGX2 - Abnormal; Notable for the following components:      Result Value   SARS Coronavirus 2 by RT PCR POSITIVE (*)    All other components within normal limits    EKG None  Radiology DG Chest Port 1 View  Result Date: 12/15/2020 CLINICAL DATA:  Cough and fever.  Positive COVID. EXAM: PORTABLE CHEST 1 VIEW COMPARISON:  Chest x-ray 07/09/2015 FINDINGS: The heart size and mediastinal contours are within normal limits. Low lung volumes with bibasilar atelectasis. No focal consolidation. No pulmonary edema. No pleural effusion. No pneumothorax. No acute osseous abnormality. IMPRESSION: Low lung volumes with no active disease. Electronically Signed   By: Tish Frederickson M.D.   On: 12/15/2020 19:55    Procedures Procedures   Medications Ordered in ED Medications  acetaminophen (TYLENOL) tablet 650 mg (650 mg Oral Given 12/15/20 1703)    ED Course  I have reviewed the triage vital signs and the nursing notes.  Pertinent labs & imaging results that were available during my care of the patient were reviewed by me and considered in my medical decision making (see chart for details).    MDM Rules/Calculators/A&P                           Patient Vitals for the past 24 hrs:  BP Temp Temp src Pulse Resp SpO2  12/15/20 1934 111/70 98.8 F (37.1 C) -- 89 18 97 %  12/15/20 1636 106/73 (!) 101.8 F (38.8 C) Oral 88 19 100 %    8:18 PM Reevaluation with update and discussion. After initial assessment and treatment, an updated evaluation reveals no change in clinical status, findings  discussed with the patient and all questions were answered. Mancel Bale   Medical Decision Making:  This patient is presenting  for evaluation of URI symptoms, which does require a range of treatment options, and is a complaint that involves a moderate risk of morbidity and mortality. The differential diagnoses include viral infection, COVID infection, bacterial infection. I decided to review old records, and in summary obese patient with report of COVID vaccines, but no boosters.  She is presenting with symptoms consistent with acute viral infection.  I did not require additional historical information from anyone.  Clinical Laboratory Tests Ordered, included  viral panel . Review indicates COVID-positive, flu negative.  Critical Interventions-clinical evaluation, laboratory testing, observation reassessment  After These Interventions, the Patient was reevaluated and was found stable for discharge.  Patient with apparent uncomplicated COVID infection and has had a vaccine.  She appears to be a good candidate for outpatient treatment with Paxlovid.  Prescription was sent to the pharmacy.  CRITICAL CARE-no Performed by: Mancel Bale  Nursing Notes Reviewed/ Care Coordinated Applicable Imaging Reviewed Interpretation of Laboratory Data incorporated into ED treatment  The patient appears reasonably screened and/or stabilized for discharge and I doubt any other medical condition or other [facility] requiring further screening, evaluation, or treatment in the ED at this time prior to discharge.  Plan: Home Medications-continue usual treatment and Tylenol for pain or fever; Home Treatments-gradual advance diet and activity, isolate/mask per CDC recommendation; return here if the recommended treatment, does not improve the symptoms; Recommended follow up-PCP, as needed     Final Clinical Impression(s) / ED Diagnoses Final diagnoses:  COVID-19    Rx / DC Orders ED Discharge Orders           Ordered    nirmatrelvir/ritonavir EUA (PAXLOVID) TABS  2 times daily        12/15/20 2015             Mancel Bale, MD 12/15/20 2020

## 2020-12-15 NOTE — ED Provider Notes (Signed)
Emergency Medicine Provider Triage Evaluation Note  Ashley Hunt , a 60 y.o. female  was evaluated in triage.  Pt complains of fever, cough and congestion.  Symptoms present for 2 days.  Reports associated body aches.  No chest pain or shortness of breath, no vomiting or diarrhea.  Unknown sick contacts.  Is concerned she likely had COVID flu.  Review of Systems  Positive: Fever, cough, chills, congestion Negative: Chest pain, shortness of breath, abdominal pain  Physical Exam  BP 106/73 (BP Location: Right Arm)   Pulse 88   Temp (!) 101.8 F (38.8 C) (Oral)   Resp 19   LMP 09/19/2015 Comment: negative preg test 10/02/15  SpO2 100%  Gen:   Awake, no distress   Resp:  Normal effort  MSK:   Moves extremities without difficulty  Other:    Medical Decision Making  Medically screening exam initiated at 5:00 PM.  Appropriate orders placed.  Ashley Hunt was informed that the remainder of the evaluation will be completed by another provider, this initial triage assessment does not replace that evaluation, and the importance of remaining in the ED until their evaluation is complete.  Febrile on arrival, Tylenol given in triage   Dartha Lodge, PA-C 12/15/20 1710    Mancel Bale, MD 12/15/20 [phone]

## 2020-12-15 NOTE — ED Notes (Signed)
Pt discharged and ambulated out of the ED without difficulty. 

## 2020-12-15 NOTE — Discharge Instructions (Addendum)
Start taking the medicine prescribed and sent to the pharmacy, tomorrow morning.  The pharmacy will be open, tomorrow after 9 AM.  This will help to treat your COVID symptoms.  You will also need to take Tylenol every 4 hours for fever.  Try to eat a regular diet and drink plenty of fluids.  Your abdominal discomfort is likely gas, and can be treated with Tylenol, and should improve with time.  Wear a mask at all times when around other people, for at least 5 days, longer if you continue to have symptoms.  Follow-up with your primary care doctor for further care and treatment as needed.

## 2020-12-16 ENCOUNTER — Other Ambulatory Visit (HOSPITAL_COMMUNITY): Payer: Self-pay

## 2020-12-24 ENCOUNTER — Other Ambulatory Visit (HOSPITAL_COMMUNITY): Payer: Self-pay

## 2020-12-31 ENCOUNTER — Encounter: Payer: Self-pay | Admitting: Critical Care Medicine

## 2021-01-01 NOTE — Progress Notes (Signed)
Is a 60 year old female primary care patient of Phillips Odor formally followed at [facility].  Comes in today with complaints of left hip lower back and leg pain.  She was in the emergency room in May for this had x-rays taken.  She does have degenerative changes in the left hip and mild changes in the lumbar spine.  She is seeking care for this.  She states a walker really did not help.  She has seen orthopedics previously but has had her orange card expire.  She is had barriers in getting the orange card renewed.  On exam there is tenderness in the left lower lumbar area and lateral point of the femoral head.  X-rays are reviewed and there is advanced arthritis in the left hip.  I called the patient's primary care provider and informed her we saw the patient today we gave the patient lidocaine patch and Voltaren gel over-the-counter and the patient will follow up with the [facility] the primary care provider will follow up with the fact the patient needs an orange card application.

## 2021-01-14 ENCOUNTER — Encounter: Payer: Self-pay | Admitting: Physician Assistant
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
## 2021-02-03 ENCOUNTER — Telehealth: Payer: Self-pay

## 2021-02-03 NOTE — Telephone Encounter (Signed)
[place], NP with [facility] the [place] would like for Dr. Roda Shutters to give her a call concerning patient.  CB# [phone].  Please advise.  Thank you.

## 2021-02-04 NOTE — Telephone Encounter (Signed)
Left voice mail

## 2021-02-05 NOTE — Telephone Encounter (Signed)
She left VM on Triage line returning your call.

## 2021-03-04 ENCOUNTER — Telehealth: Payer: Self-pay

## 2021-03-04 NOTE — Telephone Encounter (Signed)
Please see below.

## 2021-03-04 NOTE — Telephone Encounter (Signed)
West Bali, NP at [facility] would like a call back from Dr. Roda Shutters concerning patient.  CB# [phone], Cell#[phone].  Please advise.  Thank you.

## 2021-03-04 NOTE — Telephone Encounter (Signed)
Spoke to [employer]. Thanks.

## 2021-03-04 NOTE — Telephone Encounter (Signed)
noted 

## 2021-04-15 ENCOUNTER — Encounter: Payer: Self-pay | Admitting: Physician Assistant

## 2021-04-15 NOTE — Progress Notes (Signed)
Ms Name is having pain in her lower back. She just walked to the store and back. The pain goes across her lower back.   She says there is swelling on the R side of her lower back, but the pain originates on the left and goes across.   She is also having pain over her L hip. This area is a little tender.  The ibuprofen works but is running out.   She is out of lidocaine patches, but they helped.  She had a white cream that was not working very well.  She had a cream that did not work very well.    She has been having some vaginal bleeding, feels this is related to medications. This is unusual for her, she is in menopause.  She feels her hip is out of place, needs to be fixed.  She wants to wear heels again, was told that is not a good idea.   She is to follow up with [name], NP,  was told to make sure that Ms Hilbert Corrigan knows about the gynecological issues.   She was given ibuprofen, Lido patches and sanitary pads.     Theodore Demark, PA-C 04/15/2021 4:41 PM

## 2021-06-04 ENCOUNTER — Encounter: Payer: Self-pay | Admitting: Family Medicine

## 2021-06-04 NOTE — Progress Notes (Signed)
Hx arthritis, asthma, and DM here asking about pain meds  Requesting gabapentin refill.  She's taking gabapentin and ibuprofen for LLE pain which has been going on for 2 years or so.  [name] manages her gabapentin - recommended she follow up with her for that  She seemed sleepy, out of it at times - she said the PM gabapentin dose often does that to her  VS HR 67, BP 138/84, 99% RA  Encouraged follow up with [name] for gabapentin refill and to also discuss possible dose reduction, possibly d/c'ing 3rd dose?  [name], MD

## 2021-07-15 ENCOUNTER — Other Ambulatory Visit: Payer: Self-pay | Admitting: Critical Care Medicine

## 2021-07-15 ENCOUNTER — Encounter: Payer: Self-pay | Admitting: Physician Assistant

## 2021-07-15 NOTE — Progress Notes (Signed)
Pt seen by Dr Joya Gaskins  She is having problems w/ back pain. Sometimes she has upper thigh pain. She feels swelling on the L lower back and has pain w/ ambulation.   [facility] gave her flexeril and Ibuprofen 800 mg. She also has Robaxin and Flexeril.  She had some issues on a 2018 Xray, they may be worse now for her. L>R facet arthropathy L4-5, among other things.   She has some issues with disability application, says they are not able to access her prior records. Message sent to Emeterio Reeve to help with this.   She feels trapped because she has no insurance and cannot make appts with the doctors she needs.  She says there is a crack in one of the discs in her back on the images from 2018.   On her L knee, she has a small solid mass lower L later patella. She feels there is something at the upper medial edge of the L calf muscle, no abnormality on palpation.   Prior to Admission medications   Medication Sig Start Date End Date Taking? Authorizing Provider  ACETAMINOPHEN EXTRA STRENGTH 500 MG capsule Take 2 capsules by mouth 3 (three) times daily as needed. 02/03/21   [provider]  ARIPiprazole (ABILIFY) 10 MG tablet Take 10 mg by mouth daily.    [provider]  cyclobenzaprine (FLEXERIL) 10 MG tablet Take 10 mg by mouth at bedtime. 02/03/21   [provider]  diclofenac Sodium (VOLTAREN) 1 % GEL Apply 2 g topically 4 (four) times daily. 10/15/20   Corena Herter, PA-C  gabapentin (NEURONTIN) 100 MG capsule Take 100 mg by mouth 3 (three) times daily. 02/03/21   [provider]  ibuprofen (ADVIL) 800 MG tablet Take 1 tablet (800 mg total) by mouth every 6 (six) hours as needed for moderate pain. 06/26/20   Domenic Moras, PA-C  methocarbamol (ROBAXIN) 500 MG tablet Take 1 tablet (500 mg total) by mouth 2 (two) times daily. 06/26/20   Domenic Moras, PA-C  predniSONE (STERAPRED UNI-PAK 21 TAB) 10 MG (21) TBPK tablet Take 6 tabs (60mg ) day 1, 5 tabs (50mg ) day 2, 4  tabs (40mg ) day 3, 3 tabs (30mg ) day 4, 2 tabs (20mg ) day 5, and 1 tab (10mg ) day 6. 05/12/20   Joy, Shawn C, PA-C  furosemide (LASIX) 20 MG tablet Take 1 tablet (20 mg total) by mouth daily for 3 days. 09/19/17 03/20/19  Zigmund Gottron, NP   Dr Joya Gaskins to call Ms Plaicy to make sure she has all the pt records she needs and to see if they can coordinate getting her orthopedic help. Ms Duguid missed an appt today, needs to reschedule.   Rosaria Ferries, PA-C 07/15/2021 3:32 PM

## 2021-08-04 ENCOUNTER — Other Ambulatory Visit: Payer: Self-pay

## 2021-08-04 ENCOUNTER — Emergency Department (HOSPITAL_COMMUNITY)
Admission: EM | Admit: 2021-08-04 | Discharge: 2021-08-04 | Disposition: A | Discharge status: Home / Self Care | Payer: Self-pay | Attending: Emergency Medicine | Admitting: Emergency Medicine

## 2021-08-04 ENCOUNTER — Emergency Department (HOSPITAL_COMMUNITY): Payer: Self-pay

## 2021-08-04 ENCOUNTER — Encounter (HOSPITAL_COMMUNITY): Payer: Self-pay | Admitting: Emergency Medicine

## 2021-08-04 DIAGNOSIS — M25552 Pain in left hip: Secondary | ICD-10-CM | POA: Insufficient documentation

## 2021-08-04 NOTE — Discharge Instructions (Addendum)
The x-ray of your left hip did not show any fracture, the x-ray did show some arthritis.  You will need to follow-up with your orthopedist in order to have further management of this chronic complaint.  Addition I have added referral for physical therapy help with your pain.

## 2021-08-04 NOTE — ED Triage Notes (Signed)
Patient with history of left hip pain complains of increased left hip pain after slipping and catching herself yesterday. Patient alert, oriented, ambulatory with assistance, and in no apparent distress at this time.

## 2021-08-04 NOTE — ED Provider Notes (Signed)
[facility] EMERGENCY DEPARTMENT Provider Note   CSN: [id number] Arrival date & time: 08/04/21  0093     History  Chief Complaint  Patient presents with   Hip Pain    Ashley Hunt is a 61 y.o. female.  61 y.o female with a PMH of Left hip arthritis presents to the ED with a chief complaint of left hip pain sp mechanical injury. According to patient she had a fall "some time ago" was told she had a stress fracture that took over part of her left leg. She states she has had recurrent pain along the left hip.  She has been taking gabapentin 3 times daily.  She is followed by PCP, has not seen her orthopedist in some time now.  Reports today she came in as she felt she could not ambulate on her left leg.  Denies any urinary symptoms, no paresthesias, no other complaints.  The history is provided by the patient and medical records.  Hip Pain This is a recurrent problem. The current episode started yesterday. Pertinent negatives include no chest pain, no abdominal pain and no shortness of breath.      Home Medications Prior to Admission medications   Medication Sig Start Date End Date Taking? Authorizing Provider  ACETAMINOPHEN EXTRA STRENGTH 500 MG capsule Take 2 capsules by mouth 3 (three) times daily as needed. 02/03/21   [provider]  ARIPiprazole (ABILIFY) 10 MG tablet Take 10 mg by mouth daily.    [provider]  cyclobenzaprine (FLEXERIL) 10 MG tablet Take 10 mg by mouth at bedtime. 02/03/21   [provider]  diclofenac Sodium (VOLTAREN) 1 % GEL Apply 2 g topically 4 (four) times daily. 10/15/20   Lorelee New, PA-C  gabapentin (NEURONTIN) 100 MG capsule Take 100 mg by mouth 3 (three) times daily. 02/03/21   [provider]  ibuprofen (ADVIL) 800 MG tablet Take 1 tablet (800 mg total) by mouth every 6 (six) hours as needed for moderate pain. 06/26/20   Fayrene Helper, PA-C  methocarbamol (ROBAXIN) 500 MG tablet Take 1 tablet  (500 mg total) by mouth 2 (two) times daily. 06/26/20   Fayrene Helper, PA-C  furosemide (LASIX) 20 MG tablet Take 1 tablet (20 mg total) by mouth daily for 3 days. 09/19/17 03/20/19  Georgetta Haber, NP      Allergies    Shellfish allergy    Review of Systems   Review of Systems  Constitutional:  Negative for chills and fever.  HENT:  Negative for sore throat.   Respiratory:  Negative for shortness of breath.   Cardiovascular:  Negative for chest pain.  Gastrointestinal:  Negative for abdominal pain, nausea and vomiting.  Genitourinary:  Negative for difficulty urinating.  Musculoskeletal:  Positive for arthralgias.  All other systems reviewed and are negative.  Physical Exam Updated Vital Signs BP 115/81    Pulse 73    Temp 98.6 F (37 C) (Oral)    Resp 16    LMP 09/19/2015 Comment: negative preg test 10/02/15   SpO2 99%  Physical Exam Vitals and nursing note reviewed.  Constitutional:      Appearance: Normal appearance.  HENT:     Head: Normocephalic and atraumatic.     Mouth/Throat:     Mouth: Mucous membranes are moist.  Cardiovascular:     Rate and Rhythm: Normal rate.  Pulmonary:     Effort: Pulmonary effort is normal.     Breath sounds: No wheezing or  rales.  Abdominal:     General: Abdomen is flat.     Palpations: Abdomen is soft.     Tenderness: There is no right CVA tenderness or left CVA tenderness.  Musculoskeletal:     Cervical back: Normal range of motion and neck supple.     Left hip: Bony tenderness present. No deformity, lacerations, tenderness or crepitus. Normal range of motion. Decreased strength.     Comments: Pulses present full hip flexion and extension with some pain, no changes in the skin. No left calf swelling or pain.   Skin:    General: Skin is warm and dry.  Neurological:     Mental Status: She is alert and oriented to person, place, and time.    ED Results / Procedures / Treatments   Labs (all labs ordered are listed, but only abnormal  results are displayed) Labs Reviewed - No data to display  EKG None  Radiology DG Hip Unilat W or Wo Pelvis 2-3 Views Left  Result Date: 08/04/2021 CLINICAL DATA:  Fall, left hip pain EXAM: DG HIP (WITH OR WITHOUT PELVIS) 2-3V LEFT COMPARISON:  Hip radiograph 10/15/2020 FINDINGS: There is no evidence of acute left hip fracture. There is mild to moderate left hip osteoarthritis, slightly progressed from prior. IMPRESSION: No evidence of acute left hip fracture. Mild to moderate left hip osteoarthritis, slightly progressed from prior. Electronically Signed   By: Caprice Renshaw M.D.   On: 08/04/2021 08:59    Procedures Procedures    Medications Ordered in ED Medications - No data to display  ED Course/ Medical Decision Making/ A&P                           Medical Decision Making Amount and/or Complexity of Data Reviewed Radiology: ordered.  Patient presents with left hip pain that is been ongoing however worsened yesterday she felt like it was difficult for her to ambulate.  Prior history of lumbar radiculopathy secondary to spinal stenosis, and left hip arthritis.  Previously followed by Dr. Deno Etienne of orthopedics but has not seen him in a while.  Also on gabapentin 3 times daily without any improvement in her symptoms.  Does report mechanical injury yesterday, which she fell exacerbated her left hip pain.  She did have an MRI in 2018 which shows some T10-L4 degenerative disc changes.  According to extensive chart review, it looks like she last saw her orthopedics at the beginning of 2022.  X-ray obtained on today's visit which does show arthritis which has worsened since her last x-ray.  Her exam is unremarkable, has full range of motion with hip extension and flexion.  No incontinence, no prior history of IV drug use.  Look like patient takes anti-inflammatories to help with pain control.  He was offered pain medication while in the ED, which she refuses she states she takes gabapentin and  this is too sedating for her.  We did discuss no further prescription at this is an ongoing problem.  No evidence of acute left hip fracture.     Mild to moderate left hip osteoarthritis, slightly progressed from  prior.      These results were discussed with patient, she was provided with a copy of her x-ray for her records.  I urged her to see her orthopedist for further management of this chronic complaint, no acute findings on todays evaluation. Patient is stable for discharge, ambulatory in the ED.    Portions  of this note were generated with Scientist, clinical (histocompatibility and immunogenetics). Dictation errors may occur despite best attempts at proofreading.   Final Clinical Impression(s) / ED Diagnoses Final diagnoses:  Left hip pain    Rx / DC Orders ED Discharge Orders          Ordered    Ambulatory referral to Orthopedic Surgery        08/04/21 0917    Ambulatory referral to Physical Therapy        08/04/21 0917              Claude Manges, PA-C 08/04/21 7253    Gwyneth Sprout, MD 08/07/21 [phone]

## 2021-08-07 ENCOUNTER — Ambulatory Visit: Payer: Self-pay | Admitting: Physician Assistant

## 2021-08-12 ENCOUNTER — Ambulatory Visit (INDEPENDENT_AMBULATORY_CARE_PROVIDER_SITE_OTHER): Payer: Self-pay | Admitting: Orthopaedic Surgery

## 2021-08-12 ENCOUNTER — Other Ambulatory Visit: Payer: Self-pay

## 2021-08-12 DIAGNOSIS — M25552 Pain in left hip: Secondary | ICD-10-CM

## 2021-08-12 NOTE — Progress Notes (Signed)
? ?Office Visit Note ?  ?Patient: Ashley Hunt           ?Date of Birth: 1960/12/01           ?MRN: [id number] ?Visit Date: 08/12/2021 ?             ?Requested by: [name], NP ?[address] ?[place],  [place] [address] ?PCP: [name], NP ? ? ?Assessment & Plan: ?Visit Diagnoses:  ?1. Pain in left hip   ? ? ?Plan: Impression is left hip osteoarthritis and chronic left low back pain with left lower extremity radiculopathy.  I believe the patient is experiencing symptoms not only from her hip from her back.  I would however like to refer her to Dr. Alvester Morin for intra-articular cortisone injection as I feel as though the symptoms are worse.  She will follow-up with [place] as needed.  Call with concerns or questions. ? ?Follow-Up Instructions: Return if symptoms worsen or fail to improve.  ? ?Orders:  ?No orders of the defined types were placed in this encounter. ? ?No orders of the defined types were placed in this encounter. ? ? ? ? Procedures: ?No procedures performed ? ? ?Clinical Data: ?No additional findings. ? ? ?Subjective: ?Chief Complaint  ?Patient presents with  ? Left Hip - Pain  ? ? ?HPI patient is a pleasant 61 year old female who comes in today with left hip pain.  The pain she has is primarily to the left groin and anterior thigh but does note pain that occasionally occurs in the left buttock.  Symptoms are worse with hip flexion as well as with lumbar flexion.  She does note catching sensations to the groin.  She admits to a burning sensation which occurs to the left buttock at times.  She has not been to physical therapy or had cortisone injections to her left hip.  She was recently seen in the ED after a fall where x-rays of the left hip were obtained.  X-rays demonstrated moderate degenerative changes to the left hip joint.  No fracture was noted. ? ?Review of Systems as detailed in HPI.  All others reviewed and are negative. ? ? ?Objective: ?Vital Signs: LMP 09/19/2015 Comment:  negative preg test 10/02/15 ? ?Physical Exam well-developed well-nourished female in no acute distress.  Alert and oriented x3. ? ?Ortho Exam examination of the left hip reveals a negative logroll.  She does have pain with [place].  Increased pain with hip flexion and external rotation.  Negative straight leg raise.  No pain with lumbar flexion or extension.  No spinous or paraspinous tenderness.  No focal weakness.  She is neurovascular intact distally. ? ?Specialty Comments:  ?No specialty comments available. ? ?Imaging: ?No new imaging ? ? ?PMFS History: ?Patient Active Problem List  ? Diagnosis Date Noted  ? Lumbar radiculopathy 04/18/2017  ? 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 Unspecified mood (affective) disorder (HCC) 07/10/2015  ? Anxiety   ? ?Past Medical History:  ?Diagnosis Date  ? Arthritis   ? Asthma   ? Diabetes mellitus without complication (HCC)   ?  ?Family History  ?Family history unknown: Yes  ?  ?No past surgical history on file. ?Social History  ? ?Occupational History  ? Not on file  ?Tobacco Use  ? Smoking status: Never  ? Smokeless tobacco: Never  ?Substance and Sexual Activity  ? Alcohol use: No  ? Drug use: No  ? Sexual activity: Never  ? ? ? ? ? ? ?

## 2021-09-05 ENCOUNTER — Other Ambulatory Visit: Payer: Self-pay

## 2021-09-05 ENCOUNTER — Encounter (HOSPITAL_COMMUNITY): Payer: Self-pay | Admitting: Emergency Medicine

## 2021-09-05 ENCOUNTER — Emergency Department (HOSPITAL_COMMUNITY)
Admission: EM | Admit: 2021-09-05 | Discharge: 2021-09-06 | Disposition: A | Discharge status: Home / Self Care | Payer: Medicare Other | Attending: Emergency Medicine | Admitting: Emergency Medicine

## 2021-09-05 DIAGNOSIS — R21 Rash and other nonspecific skin eruption: Secondary | ICD-10-CM | POA: Diagnosis present

## 2021-09-05 DIAGNOSIS — M5432 Sciatica, left side: Secondary | ICD-10-CM | POA: Diagnosis not present

## 2021-09-05 MED ORDER — KETOROLAC TROMETHAMINE 15 MG/ML IJ SOLN
15.0000 mg | Freq: Once | INTRAMUSCULAR | Status: AC
  Administered 2021-09-05: 15 mg via INTRAMUSCULAR
  Filled 2021-09-05: qty 1

## 2021-09-05 NOTE — ED Triage Notes (Signed)
?  [facility] rash on L arm/axilla that started roughly an hour ago.  Patient states she was asleep and started scratching and it woke her up from sleep.  Only allergy is to shellfish but states it could be from using a different detergent.  Patient also complaining from L leg pain that is chronic.  Patient states she has several fractures to that leg that occurred in 2016.  Patient given benadryl via EMS.  ?

## 2021-09-05 NOTE — ED Provider Notes (Signed)
?[facility] EMERGENCY DEPARTMENT ?Provider Note ? ? ?CSN: [id number] ?Arrival date & time: 09/05/21  2311 ? ?  ? ?History ? ?Chief Complaint  ?Patient presents with  ? Allergic Reaction  ? Leg Pain  ? ? ?Ashley Hunt is a 61 y.o. female. ? ?61 yo F with a chief complaints of a rash.  She feels like this is popped up off and on the past couple days.  Headed to the right side of her face and then under her chin and then tonight it was on the left side of her arm.  She called EMS and was given a dose of Benadryl and she thinks it got a little bit better.  She also has had chronic left-sided low back pain.  This been going on for a long time.  She is seen orthopedics for this and has planned injections.  She denies any recent injury denies loss of bowel or bladder denies loss of rectal sensation.  Denies wheezing.  ? ? ?Allergic Reaction ?Leg Pain ? ?  ? ?Home Medications ?Prior to Admission medications   ?Medication Sig Start Date End Date Taking? Authorizing Provider  ?ACETAMINOPHEN EXTRA STRENGTH 500 MG capsule Take 2 capsules by mouth 3 (three) times daily as needed. 02/03/21   [provider]  ?ARIPiprazole (ABILIFY) 10 MG tablet Take 10 mg by mouth daily.    [provider]  ?cyclobenzaprine (FLEXERIL) 10 MG tablet Take 10 mg by mouth at bedtime. 02/03/21   [provider]  ?diclofenac Sodium (VOLTAREN) 1 % GEL Apply 2 g topically 4 (four) times daily. 10/15/20   Lorelee New, PA-C  ?gabapentin (NEURONTIN) 100 MG capsule Take 100 mg by mouth 3 (three) times daily. 02/03/21   [provider]  ?ibuprofen (ADVIL) 800 MG tablet Take 1 tablet (800 mg total) by mouth every 6 (six) hours as needed for moderate pain. 06/26/20   Fayrene Helper, PA-C  ?methocarbamol (ROBAXIN) 500 MG tablet Take 1 tablet (500 mg total) by mouth 2 (two) times daily. 06/26/20   Fayrene Helper, PA-C  ?furosemide (LASIX) 20 MG tablet Take 1 tablet (20 mg total) by mouth daily for 3 days. 09/19/17  03/20/19  Georgetta Haber, NP  ?   ? ?Allergies    ?Shellfish allergy   ? ?Review of Systems   ?Review of Systems ? ?Physical Exam ?Updated Vital Signs ?BP 134/85 (BP Location: Right Arm)   Pulse 75   Temp 98.4 ?F (36.9 ?C) (Oral)   Resp 18   Ht 5\' 6"  (1.676 m)   Wt 120.2 kg   LMP 09/19/2015 Comment: negative preg test 10/02/15  SpO2 98%   BMI 42.77 kg/m?  ?Physical Exam ?Vitals and nursing note reviewed.  ?Constitutional:   ?   General: She is not in acute distress. ?   Appearance: She is well-developed. She is not diaphoretic.  ?HENT:  ?   Head: Normocephalic and atraumatic.  ?Eyes:  ?   Pupils: Pupils are equal, round, and reactive to light.  ?Cardiovascular:  ?   Rate and Rhythm: Normal rate and regular rhythm.  ?   Heart sounds: No murmur heard. ?  No friction rub. No gallop.  ?Pulmonary:  ?   Effort: Pulmonary effort is normal.  ?   Breath sounds: No wheezing or rales.  ?Abdominal:  ?   General: There is no distension.  ?   Palpations: Abdomen is soft.  ?   Tenderness: There is no abdominal tenderness.  ?  Musculoskeletal:     ?   General: No tenderness.  ?   Cervical back: Normal range of motion and neck supple.  ?   Comments: Pulse motor and sensation intact to the left lower extremity.  Reflexes are 2+ and equal.  No clonus.  Negative straight leg raise test.  No abdominal discomfort. ? ?2 small erythematous lesions to the left upper arm.  ?Skin: ?   General: Skin is warm and dry.  ?Neurological:  ?   Mental Status: She is alert and oriented to person, place, and time.  ?Psychiatric:     ?   Behavior: Behavior normal.  ? ? ?ED Results / Procedures / Treatments   ?Labs ?(all labs ordered are listed, but only abnormal results are displayed) ?Labs Reviewed - No data to display ? ?EKG ?None ? ?Radiology ?No results found. ? ?Procedures ?Procedures  ? ? ?Medications Ordered in ED ?Medications  ?ketorolac (TORADOL) 15 MG/ML injection 15 mg (has no administration in time range)  ? ? ?ED Course/ Medical  Decision Making/ A&P ?  ?                        ?Medical Decision Making ?Risk ?Prescription drug management. ? ? ?61 yo F with a chief complaints of itchy rash.  This been off and on the past couple days.  May be consistent with insect bites.  Does not seem to be persistent.  Was noted to be hives by EMS to seems of gotten better with Benadryl.  We will have the patient follow-up with her family doctor.  She also is complaining of left-sided sciatica.  This is a chronic problem for her.  Has not significantly changed per my review of her records.  Had recently seen orthopedics and there is plans for steroid injection.  She has no red flags.  Will discharge home. ? ?11:42 PM:  I have discussed the diagnosis/risks/treatment options with the patient.  Evaluation and diagnostic testing in the emergency department does not suggest an emergent condition requiring admission or immediate intervention beyond what has been performed at this time.  They will follow up with  PCP. We also discussed returning to the ED immediately if new or worsening sx occur. We discussed the sx which are most concerning (e.g., sudden worsening pain, fever, inability to tolerate by mouth) that necessitate immediate return. Medications administered to the patient during their visit and any new prescriptions provided to the patient are listed below. ? ?Medications given during this visit ?Medications  ?ketorolac (TORADOL) 15 MG/ML injection 15 mg (has no administration in time range)  ? ? ? ?The patient appears reasonably screen and/or stabilized for discharge and I doubt any other medical condition or other [facility] requiring further screening, evaluation, or treatment in the ED at this time prior to discharge.  ? ? ? ? ? ? ? ? ?Final Clinical Impression(s) / ED Diagnoses ?Final diagnoses:  ?Rash  ?Sciatica of left side  ? ? ?Rx / DC Orders ?ED Discharge Orders   ? ? None  ? ?  ? ? ?  ?Melene Plan, DO ?09/05/21 2342 ? ?

## 2021-09-05 NOTE — Discharge Instructions (Signed)
Take 4 over the counter ibuprofen tablets 3 times a day or 2 over-the-counter naproxen tablets twice a day for pain. ?Also take tylenol 1000mg (2 extra strength) four times a day.  ? ? ?Try to use only dye and perfume free soaps and lotions.  Follow-up with your doctor in the office. ? ?

## 2021-09-09 ENCOUNTER — Encounter: Payer: Self-pay | Admitting: Physician Assistant

## 2021-09-09 NOTE — Progress Notes (Signed)
She went to the ER 03/25 for itching that started at night, got benadryl  by EMS and toradol in the ER, and got better.  ? ?The itching came back the next night and kept her from sleeping.  ? ?This has happened every night since then.  ? ?She can use Dove soap, but only the brownish one. Myriam Jacobson will look for this.  ? ?She has not had sx during the day.  ? ?She is allergic to perfumes in lotions and soaps. ? ?She has fungus in her L big toe, works w/ PCP on this, has to take rx about once a year. Will f/u with her for this.  ?Placey, Chales Abrahams, NP ? ?She can take benadryl at bedtime, Myriam Jacobson will get some.  ? ?Was also given Dermasil hypoallergenic lotion and Cortisone 10 cream. F/u with Ms Plaicey.  ? ?Theodore Demark, PA-C ?09/09/2021 ?5:00 PM ? ? ? ? ? ? ? ? ? ? ? ? ? ?

## 2021-09-29 ENCOUNTER — Ambulatory Visit: Payer: Medicare Other | Admitting: Orthopaedic Surgery

## 2022-10-25 IMAGING — DX DG HIP (WITH OR WITHOUT PELVIS) 2-3V*L*
3 series · 3 of 3 positions shown · non-contrast
Comparison: 10/02/2015

CLINICAL DATA: Left hip pain.

EXAM:
DG HIP (WITH OR WITHOUT PELVIS) 2-3V LEFT

[pelvis ap]
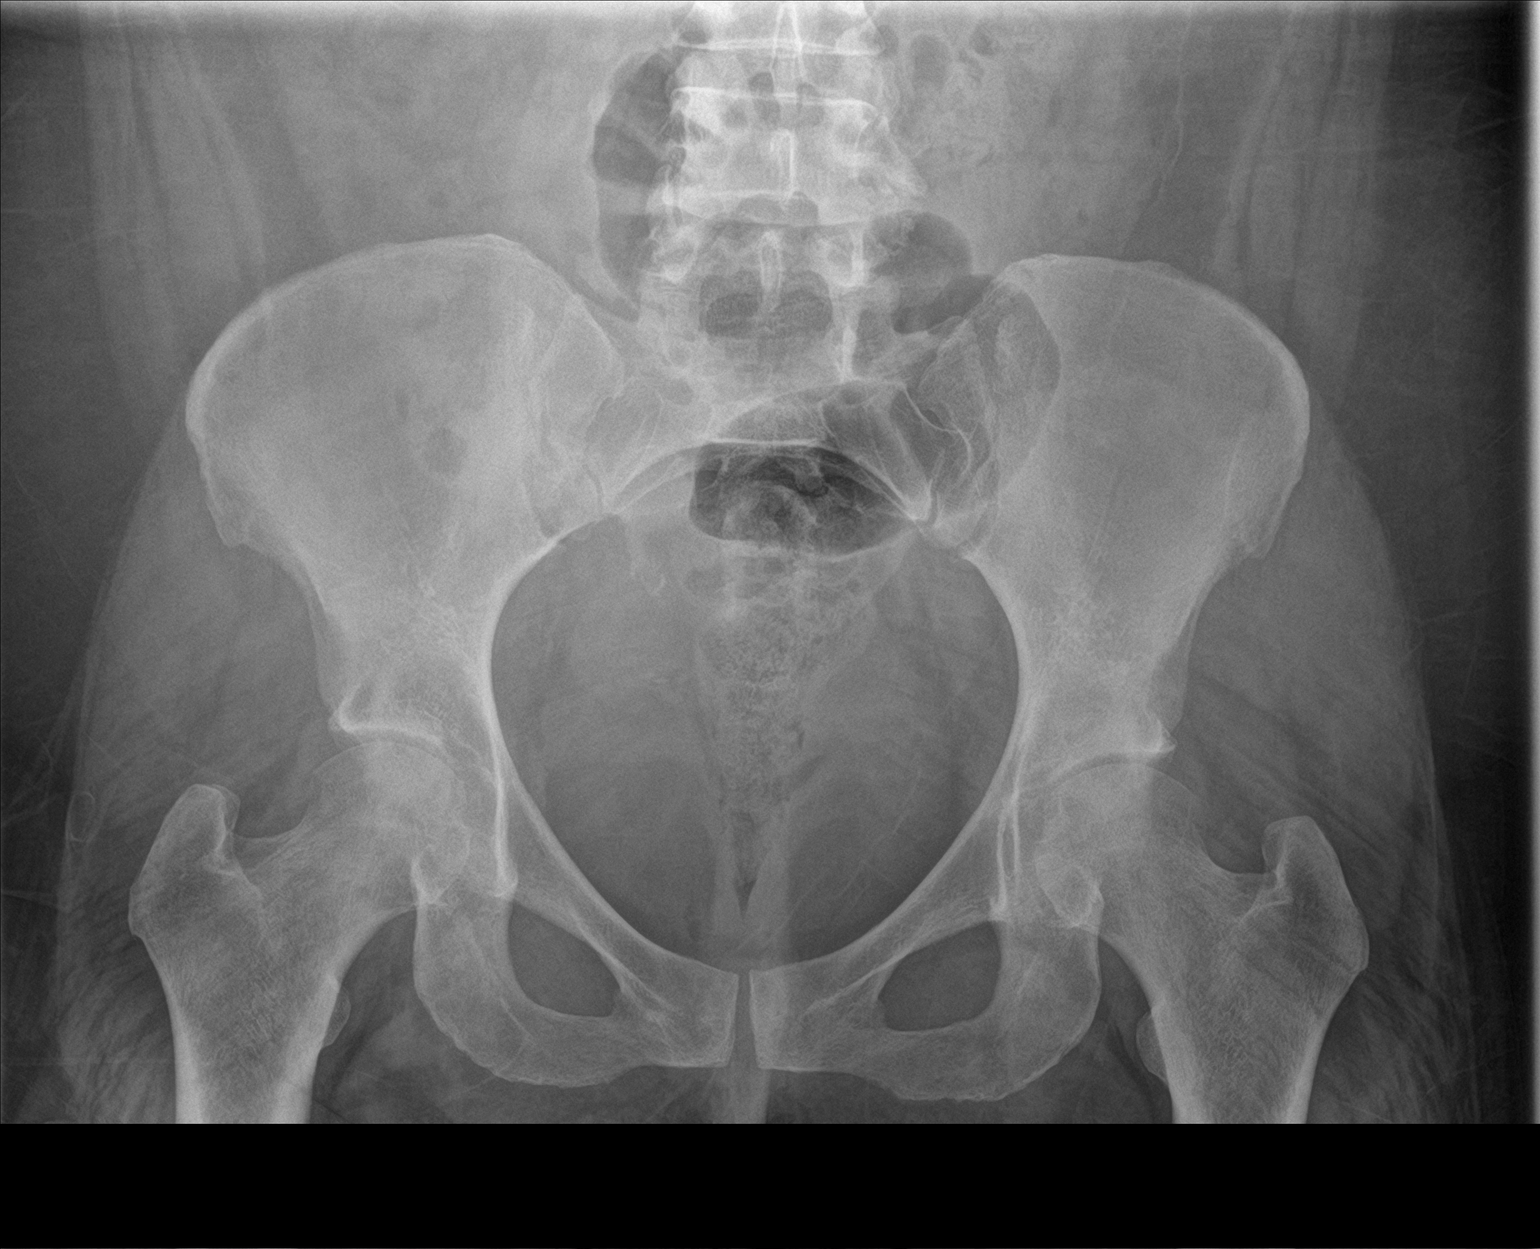

[hip ap]
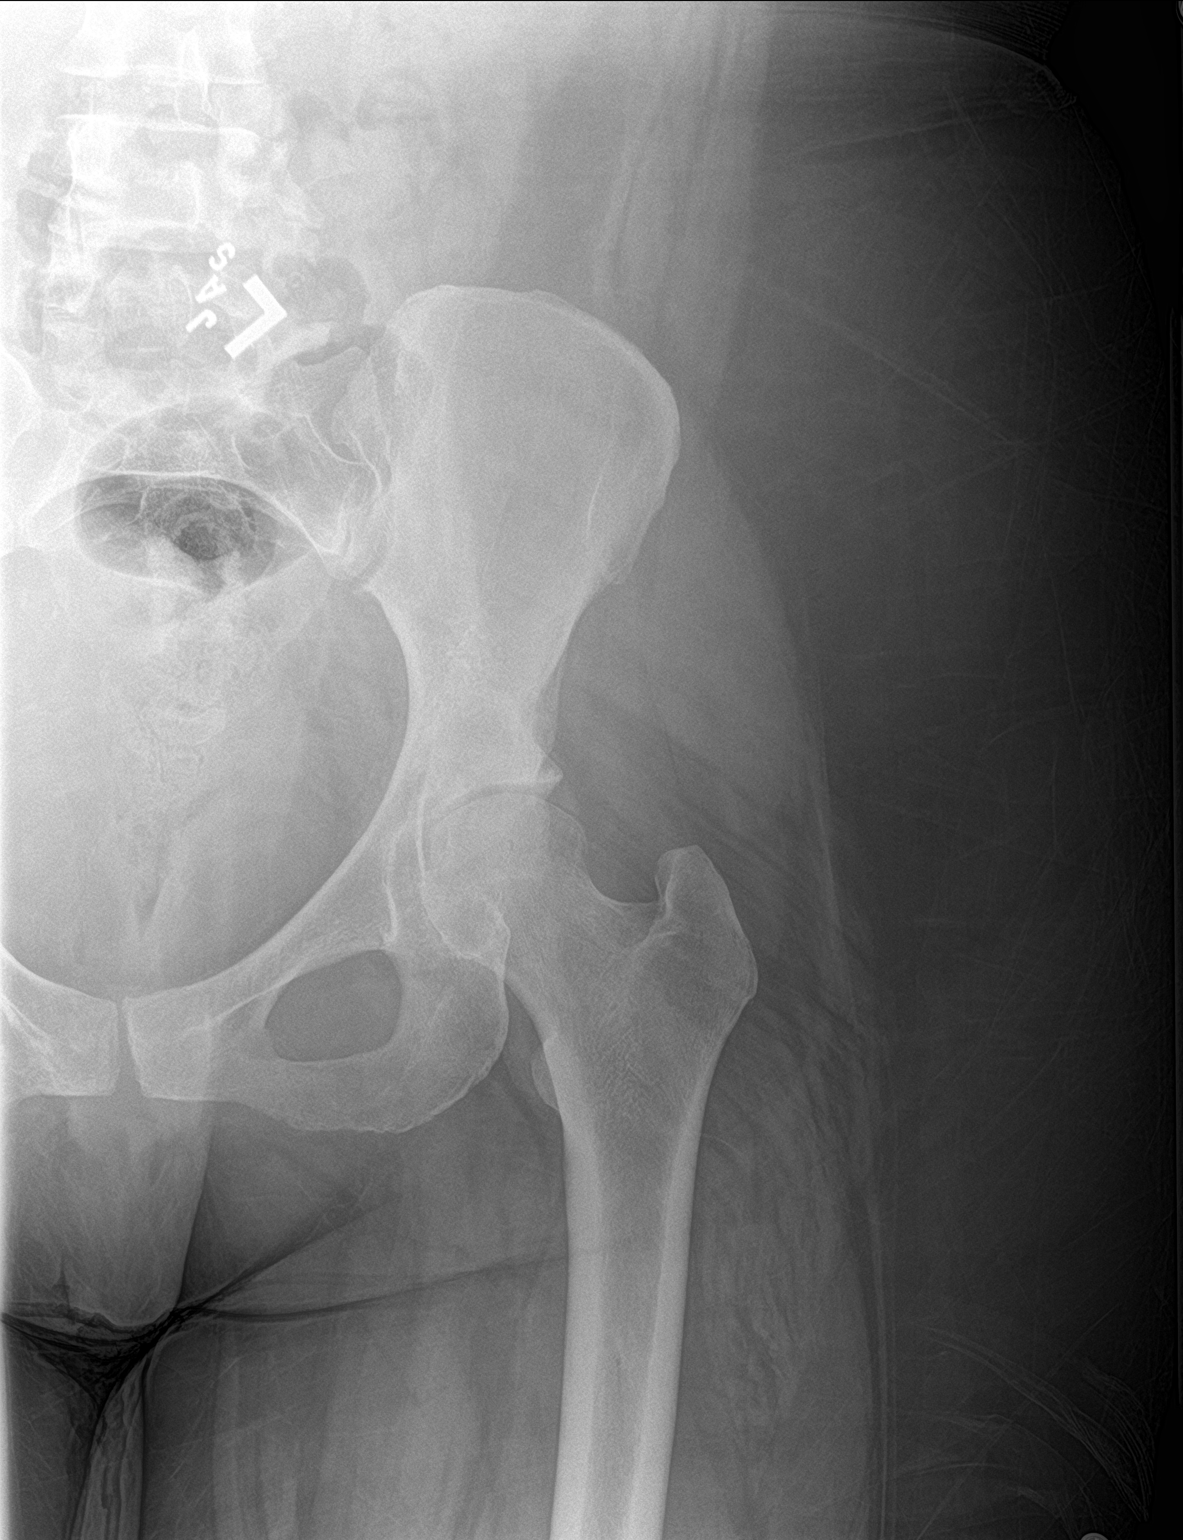

[hip lat]
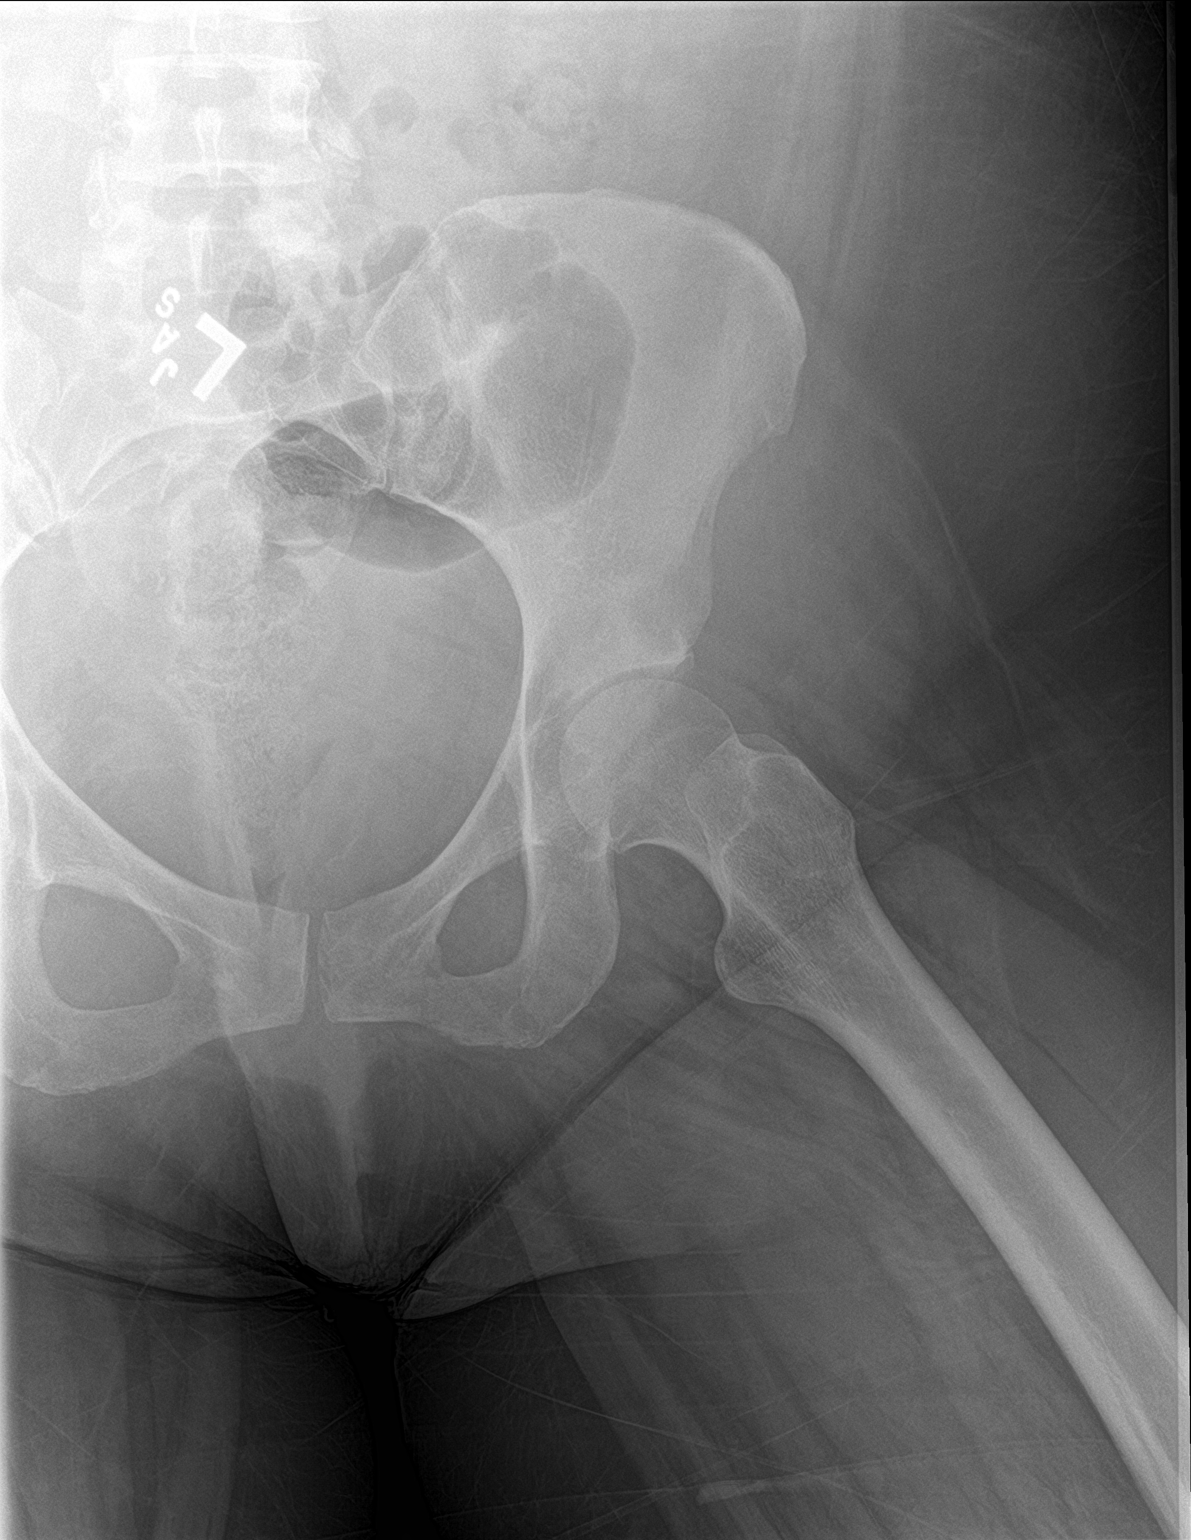

[3 of 3 positions shown; findings below may reference images not displayed]

FINDINGS: Pelvic bony ring is intact. No gross abnormality to the right hip.
Left hip is located without a fracture or significant joint space
narrowing.
IMPRESSION: No acute abnormality to the pelvis or left hip. No significant
degenerative changes in the hips.

## 2023-03-30 IMAGING — CR DG HIP (WITH OR WITHOUT PELVIS) 2-3V*L*
3 series · 3 of 3 positions shown · non-contrast
Comparison: Radiograph 05/12/2020

CLINICAL DATA: left hip pain, acute on chronic

EXAM:
DG HIP (WITH OR WITHOUT PELVIS) 2-3V LEFT

[hip ap]
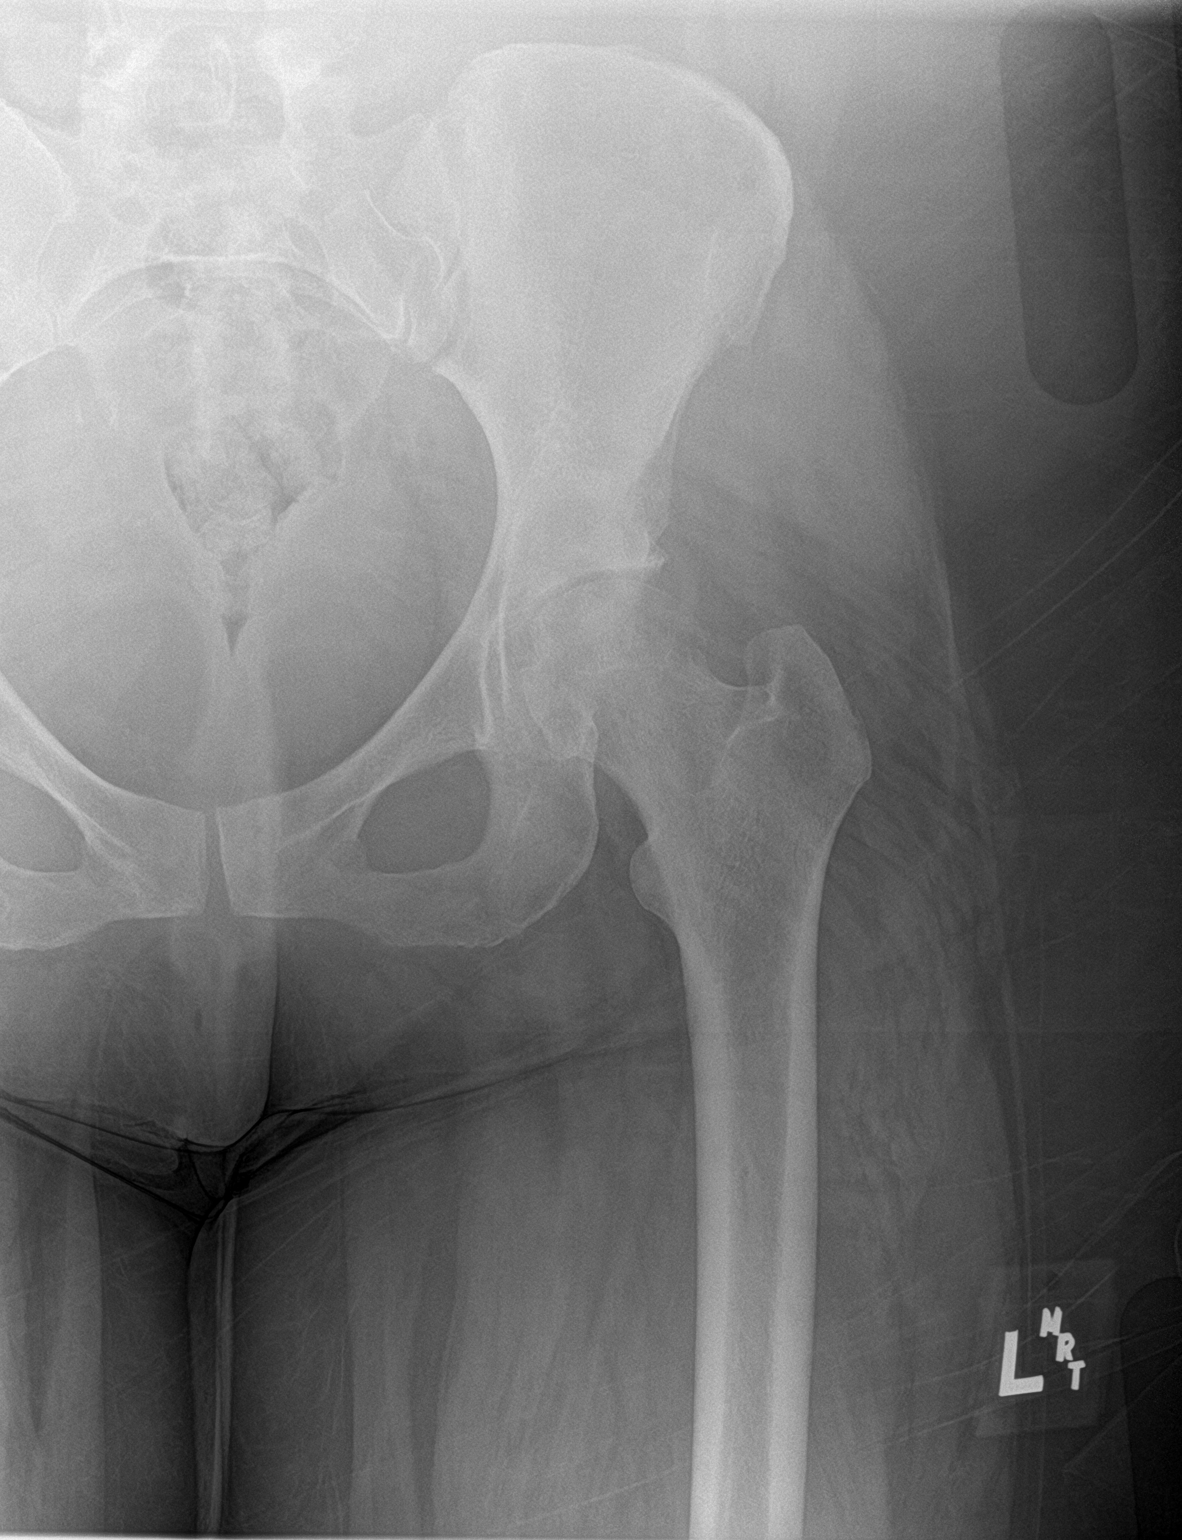

[hip lat]
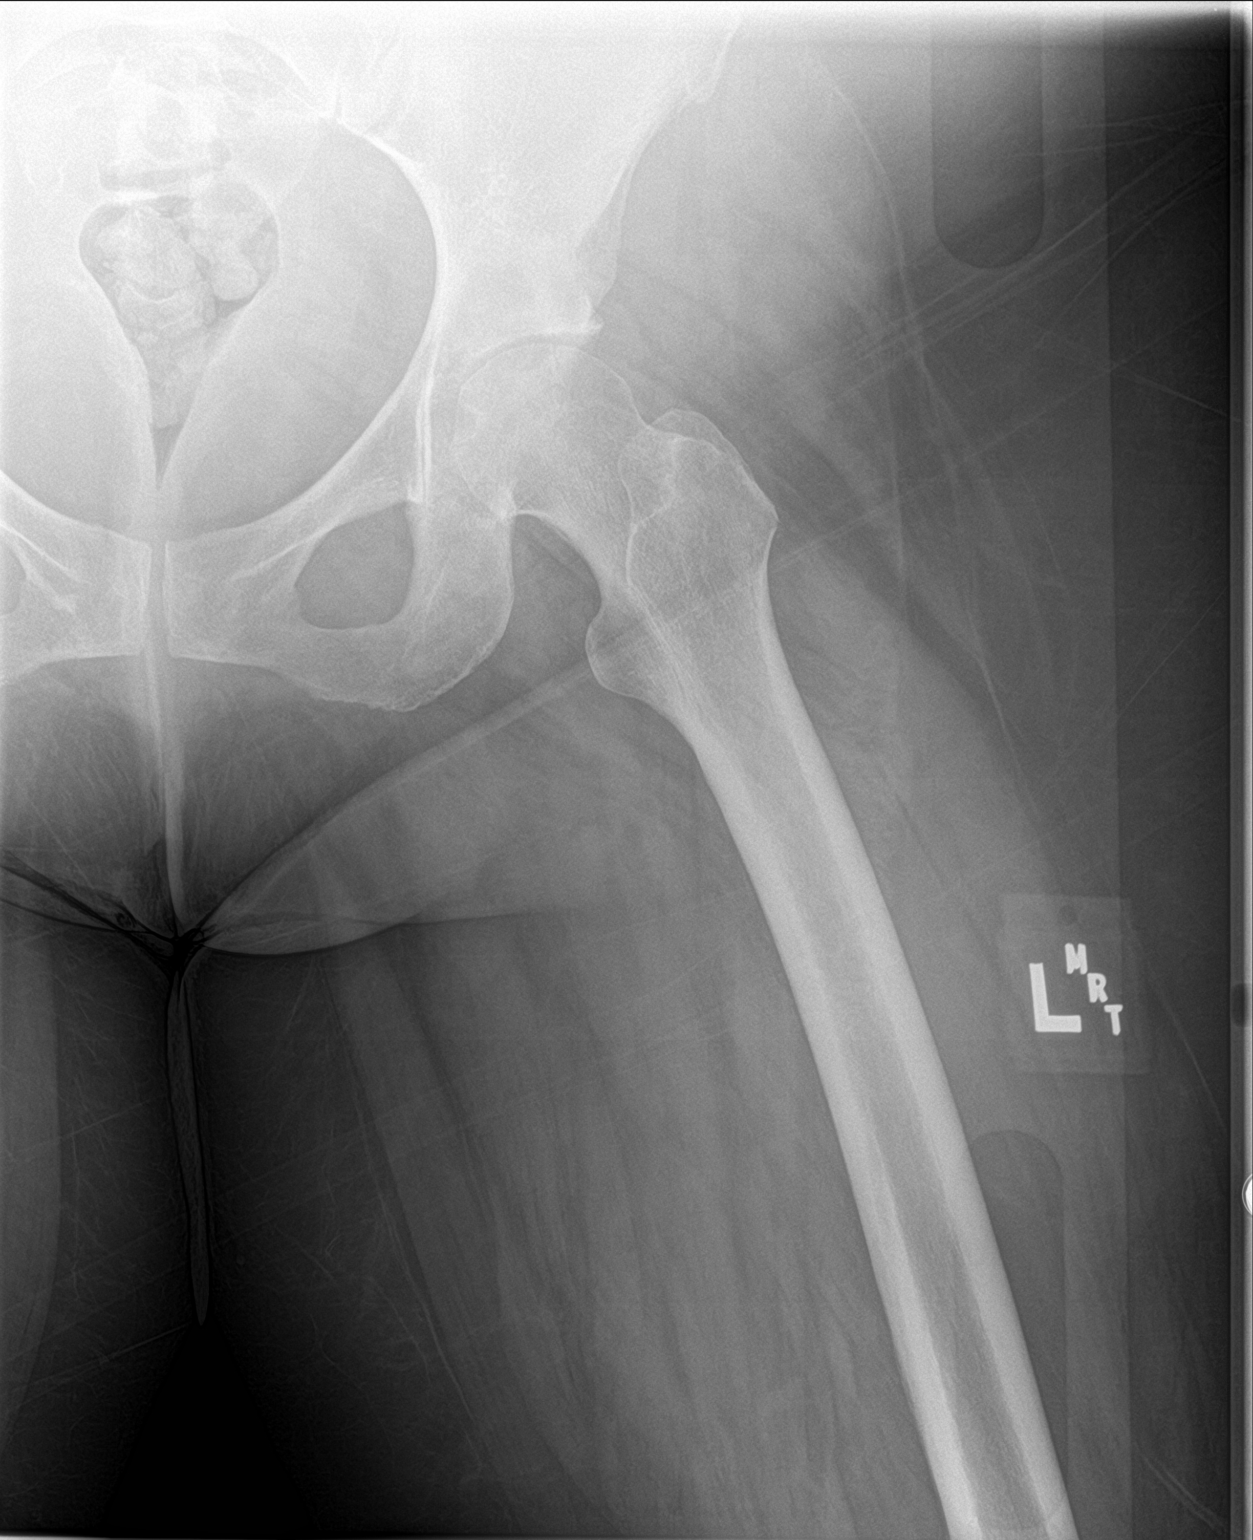

[pelvis ap]
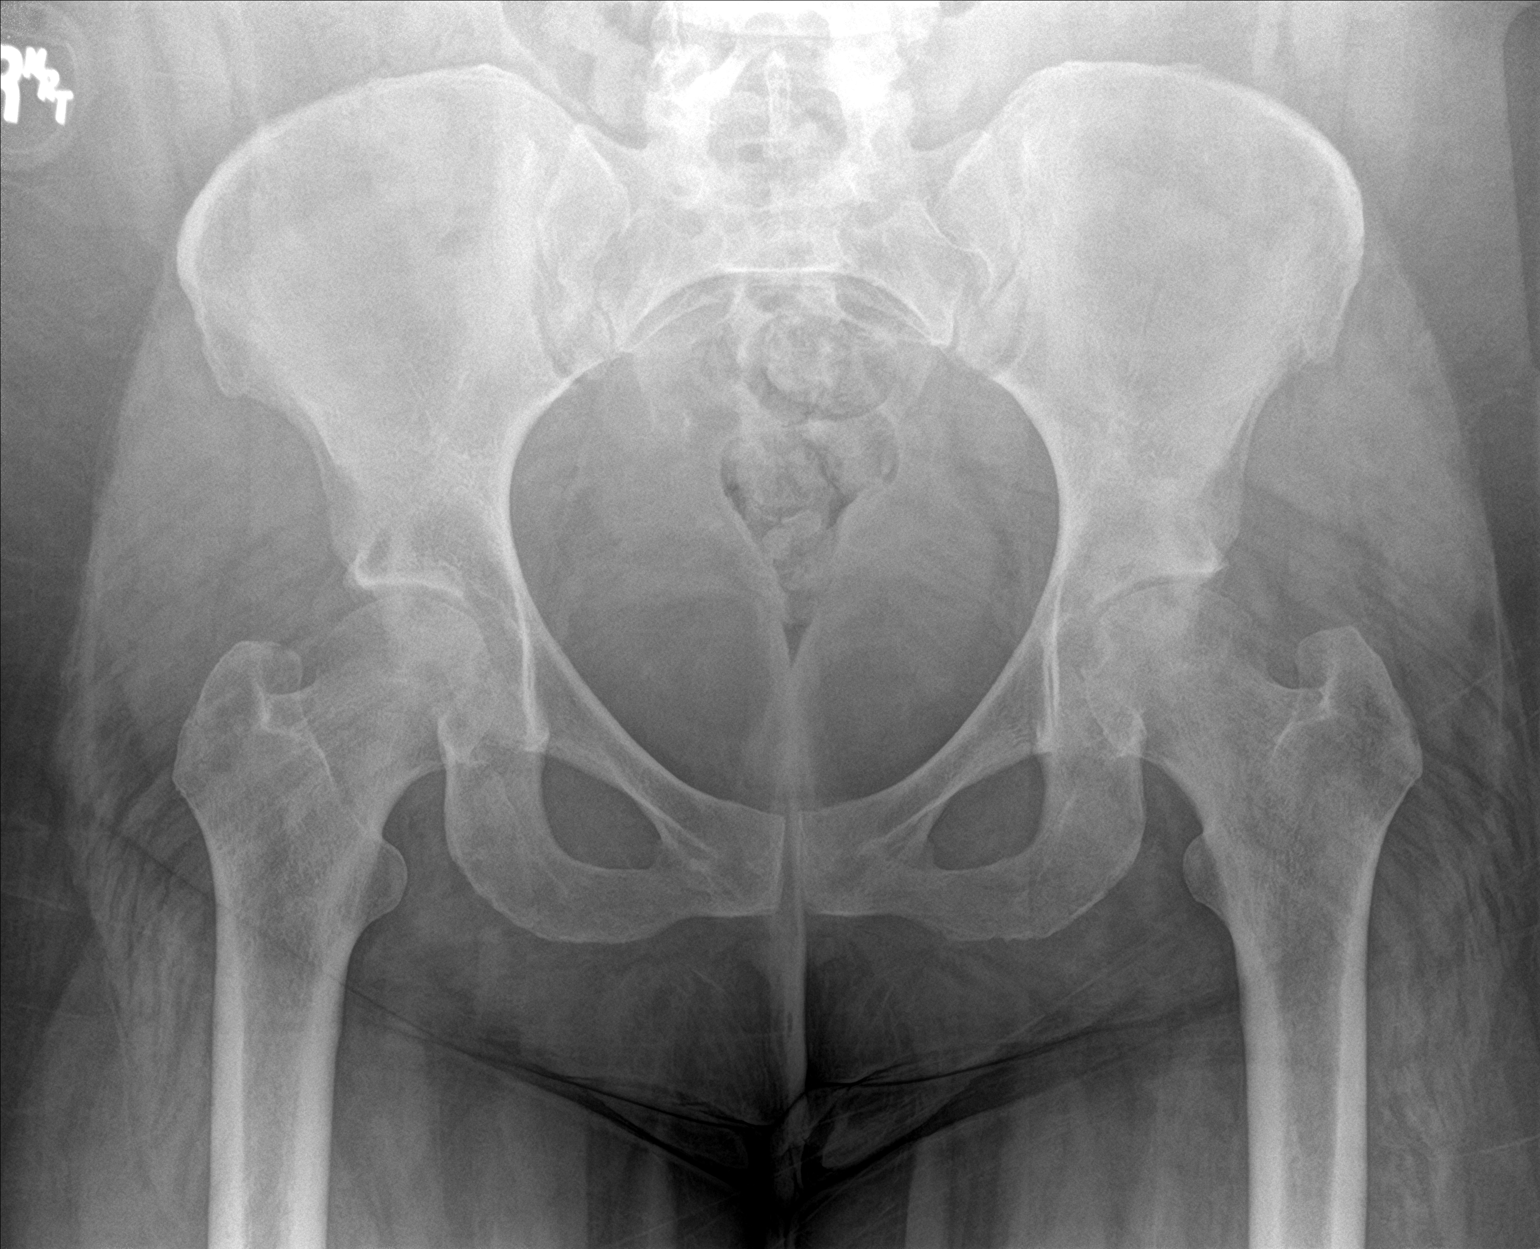

[3 of 3 positions shown; findings below may reference images not displayed]

FINDINGS: There is no evidence of fracture. There is mild left hip
degenerative change.
IMPRESSION: Mild left hip degenerative change.  No evidence of fracture.
